# Patient Record
Sex: Female | Born: 1965 | Race: Black or African American | Hispanic: No | Marital: Married | Smoking: Never smoker
Health system: Southern US, Community
[De-identification: ages and names within clinical notes are randomized; demographics above are authoritative.]

## PROBLEM LIST (undated history)

## (undated) DIAGNOSIS — T7840XA Allergy, unspecified, initial encounter: Secondary | ICD-10-CM

## (undated) DIAGNOSIS — N840 Polyp of corpus uteri: Secondary | ICD-10-CM

## (undated) DIAGNOSIS — Z973 Presence of spectacles and contact lenses: Secondary | ICD-10-CM

## (undated) DIAGNOSIS — D509 Iron deficiency anemia, unspecified: Secondary | ICD-10-CM

## (undated) HISTORY — DX: Allergy, unspecified, initial encounter: T78.40XA

## (undated) HISTORY — PX: TUBAL LIGATION: SHX77

---

## 1999-07-18 ENCOUNTER — Emergency Department (HOSPITAL_COMMUNITY): Admission: EM | Admit: 1999-07-18 | Discharge: 1999-07-18 | Payer: Self-pay | Admitting: Emergency Medicine

## 1999-07-22 ENCOUNTER — Emergency Department (HOSPITAL_COMMUNITY): Admission: EM | Admit: 1999-07-22 | Discharge: 1999-07-22 | Payer: Self-pay | Admitting: Emergency Medicine

## 2000-03-14 ENCOUNTER — Encounter: Payer: Self-pay | Admitting: Emergency Medicine

## 2000-03-14 ENCOUNTER — Emergency Department (HOSPITAL_COMMUNITY): Admission: EM | Admit: 2000-03-14 | Discharge: 2000-03-14 | Payer: Self-pay | Admitting: Emergency Medicine

## 2001-08-14 ENCOUNTER — Emergency Department (HOSPITAL_COMMUNITY): Admission: EM | Admit: 2001-08-14 | Discharge: 2001-08-14 | Payer: Self-pay | Admitting: Emergency Medicine

## 2002-08-16 ENCOUNTER — Other Ambulatory Visit: Admission: RE | Admit: 2002-08-16 | Discharge: 2002-08-16 | Payer: Self-pay | Admitting: Obstetrics and Gynecology

## 2003-01-24 ENCOUNTER — Emergency Department (HOSPITAL_COMMUNITY): Admission: EM | Admit: 2003-01-24 | Discharge: 2003-01-24 | Payer: Self-pay | Admitting: Emergency Medicine

## 2003-02-13 ENCOUNTER — Inpatient Hospital Stay (HOSPITAL_COMMUNITY): Admission: RE | Admit: 2003-02-13 | Discharge: 2003-02-16 | Payer: Self-pay | Admitting: Obstetrics and Gynecology

## 2003-03-27 ENCOUNTER — Other Ambulatory Visit: Admission: RE | Admit: 2003-03-27 | Discharge: 2003-03-27 | Payer: Self-pay | Admitting: Obstetrics and Gynecology

## 2003-12-28 ENCOUNTER — Other Ambulatory Visit: Admission: RE | Admit: 2003-12-28 | Discharge: 2003-12-28 | Payer: Self-pay | Admitting: Obstetrics and Gynecology

## 2004-06-27 ENCOUNTER — Inpatient Hospital Stay (HOSPITAL_COMMUNITY): Admission: RE | Admit: 2004-06-27 | Discharge: 2004-06-30 | Payer: Self-pay | Admitting: Obstetrics and Gynecology

## 2004-06-27 ENCOUNTER — Encounter (INDEPENDENT_AMBULATORY_CARE_PROVIDER_SITE_OTHER): Payer: Self-pay | Admitting: *Deleted

## 2004-08-08 ENCOUNTER — Other Ambulatory Visit: Admission: RE | Admit: 2004-08-08 | Discharge: 2004-08-08 | Payer: Self-pay | Admitting: Obstetrics and Gynecology

## 2006-07-06 LAB — HM DEXA SCAN

## 2007-01-11 ENCOUNTER — Ambulatory Visit: Payer: Self-pay | Admitting: Gastroenterology

## 2007-01-25 ENCOUNTER — Ambulatory Visit: Payer: Self-pay | Admitting: Gastroenterology

## 2008-06-23 LAB — HM COLONOSCOPY: HM Colonoscopy: NORMAL

## 2010-01-21 LAB — HM PAP SMEAR

## 2010-01-21 LAB — HM DEXA SCAN

## 2010-03-26 ENCOUNTER — Encounter: Admission: RE | Admit: 2010-03-26 | Discharge: 2010-03-26 | Payer: Self-pay | Admitting: Obstetrics and Gynecology

## 2010-06-12 ENCOUNTER — Ambulatory Visit: Payer: Self-pay | Admitting: Family Medicine

## 2010-06-23 LAB — HM MAMMOGRAPHY

## 2010-07-13 ENCOUNTER — Encounter: Payer: Self-pay | Admitting: Obstetrics and Gynecology

## 2010-11-08 NOTE — H&P (Signed)
NAMEANJANETTE, GILKEY             ACCOUNT NO.:  [id number]   MEDICAL RECORD NO.:  [id number]          PATIENT TYPE:  INP   LOCATION:  NA                            FACILITY:  [facility]   PHYSICIAN:  Juluis Mire, M.D.   DATE OF BIRTH:  [birth year]   DATE OF ADMISSION:  DATE OF DISCHARGE:                                HISTORY & PHYSICAL   CHIEF COMPLAINT:  The patient is a 45 year old gravida 3 para 2 married  female with last menstrual period of September 30, 2003 giving her an estimated  date of confinement of July 06, 2004 and estimated gestational age of  approximately 39 weeks.  This is consistent with initial exam and early  ultrasounds.  The patient presents for repeat cesarean section.   PRESENT ADMISSION:  The patient has had two prior cesarean sections.  She  will proceed with repeat cesarean section at the present time.  She is  desirous of permanent sterilization.  Alternative forms of birth control  have been discussed.  The potential irreversibility of sterilization is  explained.  A failure rate of 1 in 200 is quoted.  Failures can be in the  form of ectopic pregnancy requiring further surgical management.   The patient was also at risk for advanced maternal age.  She underwent first  trimester screen at [facility] [place] with a negative  first trimester ultrasound and blood work.  She declined amniocentesis.  Follow-up second trimester ultrasound and neural tube screening were also  negative.  She has also had a hemoglobin electrophoresis that indicated no  evidence of sickle cell trait.  She has no complications during the  pregnancy.   ALLERGIES:  The patient has no known drug allergies.   MEDICATIONS:  Include prenatal vitamins.   For past medical history, family history, and social history, please see  prenatal records.   REVIEW OF SYSTEMS:  Noncontributory.   PHYSICAL EXAMINATION:  VITAL SIGNS:  The patient is afebrile with stable  vital  signs.  HEENT:  The patient is normocephalic.  Pupils equal, round, and reactive to  light and accommodation.  Extraocular movements were intact.  Sclerae and  conjunctivae were clear, oropharynx clear.  NECK:  Without thyromegaly.  BREASTS:  Glandular but no discrete masses.  LUNGS:  Clear.  CARDIOVASCULAR:  Regular rate and rhythm.  There is a grade 2/6 systolic  ejection murmur felt to be a flow murmur.  ABDOMEN:  Confirms a gravid uterus consistent with dates.  There is a well-  healed low transverse incision.  PELVIC:  It is deferred at the present time due to planned cesarean section.  EXTREMITIES:  Trace edema.  NEUROLOGIC:  Grossly within normal limits.  Deep tendon reflexes 2+.  There  is no clonus.   IMPRESSION:  1.  Intrauterine pregnancy at term with two prior cesarean sections, desires      repeat.  2.  Advanced maternal age.   PLAN:  At the present time, the patient to undergo repeat cesarean section  with bilateral tubal ligation.  The risks of surgery have been discussed  including the risk of infection; the risk of hemorrhage that could require  transfusion with the risk of AIDS or hepatitis; the risk of injury to  adjacent organs including bladder, bowel, or ureters that could require  further exploratory surgery; the risk of deep venous thrombosis and  pulmonary embolus.  The patient professed an understanding of the  indications and risks and accepting of them.      JSM/MEDQ  D:  06/27/2004  T:  06/27/2004  Job:  [id number]

## 2010-11-08 NOTE — Discharge Summary (Signed)
[name], [name]             ACCOUNT NO.:  [id number]   MEDICAL RECORD NO.:  [id number]          PATIENT TYPE:  INP   LOCATION:  9103                          [facility]ACILITY:  [facility]   PHYSICIAN:  Michelle L. Grewal, M.D.DATE OF BIRTH:  1965-10-18   DATE OF ADMISSION:  06/27/2004  DATE OF DISCHARGE:  06/30/2004                                 DISCHARGE SUMMARY   ADMITTING DIAGNOSES:  1.  Intrauterine pregnancy at term.  2.  Previous cesarean section, desires repeat.  3.  Multiparity, desires sterility.   DISCHARGE DIAGNOSES:  1.  Status post low transverse cesarean section.  2.  Viable female infant.  3.  Permanent sterilization.   PROCEDURE:  1.  Repeat low transverse cesarean section.  2.  Bilateral tubal ligation.   REASON FOR ADMISSION:  Please see dictated H&P.   HOSPITAL COURSE:  The patient was a 45 year old married female gravida 3  para 2 that was admitted to [facility] for a scheduled  cesarean section.  The patient had had a previous cesarean delivery and  desired repeat.  Due to multiparity the patient had also requested permanent  sterilization.  On the morning of admission the patient was taken to the  operating room where spinal anesthesia was administered without difficulty.  A low transverse incision was made with the delivery of a viable female infant  weighing 7 pounds 4 ounces, Apgars of 9 at one minute and 9 at five minutes.  Bilateral tubal ligation was performed without difficulty.  The patient  tolerated the procedure well and was taken to the recovery room in stable  condition.  On postoperative day #1 the patient was without complaint.  Vital signs were stable, she was afebrile.  Abdomen was soft, fundus was  firm and nontender.  Abdominal dressing was noted to be clean, dry, and  intact.  Labs revealed hemoglobin of 9.0; platelet count of 137,000; wbc  count of 8.5.  On postoperative day #2 the patient was without complaint.  Vital signs  were stable.  Abdomen was soft, fundus was firm and nontender.  Abdominal dressing had been removed revealing an incision that was clean,  dry, and intact.  She was ambulating well and tolerating a regular diet  without complaints of nausea and vomiting.  On postoperative day #3 the  patient was doing well.  She was uncertain about discharge for that day.  Vital signs were stable.  Fundus was firm and nontender.  Incision was  clean, dry, and intact.  Staples were removed and the patient later decided  to be discharged home. Instructions had been reviewed and prescriptions had  been given and the patient was discharged home.   CONDITION ON DISCHARGE:  Good.   DIET:  Regular as tolerated.   ACTIVITY:  No heavy lifting, no driving x2 weeks, no vaginal entry.   FOLLOW-UP:  The patient is to follow up in the office in 1-2 weeks for an  incision check.  She is to call for temperature greater than 100 degrees,  persistent nausea and vomiting, heavy vaginal bleeding, and/or redness or  drainage from  the incisional site.   DISCHARGE MEDICATIONS:  1.  Tylox #30 one p.o. q.4-6h. p.r.n.  2.  Motrin 600 mg q.6h.  3.  Prenatal vitamins one p.o. daily.  4.  Colace one p.o. daily p.r.n.      CC/MEDQ  D:  07/19/2004  T:  07/19/2004  Job:  [id number]

## 2010-11-08 NOTE — H&P (Signed)
NAME:  [name], [name]                         ACCOUNT NO.:  [id number]   MEDICAL RECORD NO.:  [id number]                   PATIENT TYPE:   LOCATION:                                       FACILITY:   PHYSICIAN:  Juluis Mire, M.D.                DATE OF BIRTH:   DATE OF ADMISSION:  02/13/2003  DATE OF DISCHARGE:                                HISTORY & PHYSICAL   HISTORY OF PRESENT ILLNESS:  The patient is a 45 year old gravida 3, para 1  married female who presents for repeat cesarean section.   The patient's last menstrual period is in question.  Ultrasound estimated  date of confinement of August 31 giving her an estimated gestational age of  [redacted] weeks.  She had a prior low transverse cesarean section due to fetal  intolerance of labor.  She has declined trial of labor, wishing to proceed  with repeat cesarean section for which she is admitted at the present time.   Other risk factors:  She was at risk for advanced maternal age.  We had  discussed with her increasing risk of genetic anomalies associated with  advanced maternal age.  She declined genetic amniocentesis.  She did undergo  a targeted ultrasound that did reveal an echogenic focus in the left  ventricle.  Her maternal serum screen did indicate an increased risk of Down  syndrome to 1 in 150.  She still declined amniocentesis.  She was referred  to the [facility] [place].  A subsequent targeted  ultrasound failed to reveal any abnormality and the patient maintained the  pregnancy without any further evaluation.  Only other issue she was screened  positive for group B Strep.   ALLERGIES:  No known drug allergies.   MEDICATIONS:  Prenatal vitamins.   PRENATAL LABORATORIES:  The patient's blood type is B+.  She is negative  antibody screen.  Nonreactive serology.  Immune to rubella.  Negative  hepatitis B surface antigen.   PAST MEDICAL HISTORY:  Please see prenatal records.   FAMILY  HISTORY:  Please see prenatal records.   SOCIAL HISTORY:  Please see prenatal records.   REVIEW OF SYSTEMS:  Noncontributory.   PHYSICAL EXAMINATION:  VITAL SIGNS:  The patient is afebrile with stable  vital signs.  HEENT:  The patient normocephalic.  Pupils are equal, round, and reactive to  light and accommodation.  Extraocular movements are intact.  Sclerae and  conjunctivae clear.  Oropharynx clear.  NECK:  Without thyromegaly.  BREASTS:  No discreet mass.  LUNGS:  Clear.  CARDIAC:  Regular rhythm and rate without murmurs or gallops.  ABDOMEN:  Consistent with a gravid uterus consistent with dates.  PELVIC:  Cervix is long and closed.  EXTREMITIES:  Trace edema.  NEUROLOGIC:  Grossly within normal limits.   IMPRESSION:  Intrauterine pregnancy at term with prior cesarean section,  desires a repeat.  PLAN:  The patient will undergo repeat cesarean section.  The risks of  surgery have been discussed including the risk of infection, the risk of  hemorrhage that could necessitate transfusion with the risk of AIDS or  hepatitis, the risk of injury to adjacent organs including bladder, bowel,  ureters that could require further exploratory surgery, the risk of deep  venous thrombosis and pulmonary embolus.  The patient expressed  understanding of indications and risks and accepting of them.                                               Juluis Mire, M.D.    JSM/MEDQ  D:  02/13/2003  T:  02/13/2003  Job:  [id number]

## 2010-11-08 NOTE — Op Note (Signed)
[name], [name]             ACCOUNT NO.:  [id number]   MEDICAL RECORD NO.:  [id number]          PATIENT TYPE:  INP   LOCATION:  9198                          [facility]ACILITY:  [facility]   PHYSICIAN:  Juluis Mire, M.D.   DATE OF BIRTH:  1966/04/30   DATE OF PROCEDURE:  06/27/2004  DATE OF DISCHARGE:                                 OPERATIVE REPORT   PREOPERATIVE DIAGNOSES:  1.  Intrauterine pregnancy at term with prior cesarean section, desires      repeat.  2.  Multiparity, desires sterility.   POSTOPERATIVE DIAGNOSES:  1.  Intrauterine pregnancy at term with prior cesarean section, desires      repeat.  2.  Multiparity, desires sterility.   OPERATIVE PROCEDURES:  1.  Repeat low transverse cesarean section.  2.  Bilateral tubal ligation.   SURGEON:  Juluis Mire, M.D.   ANESTHESIA:  Spinal.   ESTIMATED BLOOD LOSS:  500 mL.   PACKS AND DRAINS:  None.   INTRAOPERATIVE BLOOD REPLACED:  None.   COMPLICATIONS:  None.   INDICATIONS:  Dictated in the history and physical.   The procedure was as follows:  The patient was taken into the OR, placed in  the supine position with left lateral tilt.  After a satisfactory level of  spinal anesthesia was obtained, the abdomen was prepped out with Betadine  and draped as a sterile field.  A low transverse skin incision was  identified and excised.  The incision was extended through the subcutaneous  tissue.  The fascia was entered sharply and the incision in the fascia  extended laterally.  The fascia taken off the muscle superiorly inferiorly.  The rectus muscles were separated in the midline.  The peritoneum was  entered sharply and the incision in the peritoneum was extended both  superiorly and inferiorly.  A low transverse bladder flap was developed, a  low transverse uterine incision begun with a knife and extended laterally  using manual traction.  Amniotic fluid was clear.  The infant was in the  vertex presentation,  delivered with fundal pressure and the aid of the  vacuum extractor.  The infant was a viable female who weighed 7 pounds 4  ounces, Apgars were 9/9, umbilical cord pH was 7.28.  The placenta was then  delivered manually, the uterus exteriorized for closure.  The uterus closed  using a locking suture of 2-0 chromic using two-layer closure technique.   Both tubes identified and held with a Babcock tenaculum.  A hole was made in  an avascular area of the mesosalpinx.  Individual ligatures of 0 plain  catgut were used to ligate off a segment of tube.  Intervening segment of  tube was then excised.  Cut ends of the tubes were cauterized using the  Bovie.  Both sides were adequately excised.  Ovaries were unremarkable.  The  uterus was returned to the abdominal cavity.  Urine output was clear.  We  got good hemostasis.  We irrigated the pelvis.  Again we had good  hemostasis.  Muscle was reapproximated with a running suture of 3-0 Vicryl,  fascia closed with running suture of 0 PDS, skin was closed with staples and  Steri-Strips.  Sponge, instrument, and needle count reported as correct by  circulating nurse x2.  Foley catheter remained clear at time of closure.  The patient tolerated the procedure well, was returned to the recovery room  in good condition.      JSM/MEDQ  D:  06/27/2004  T:  06/27/2004  Job:  [id number]

## 2010-11-08 NOTE — Discharge Summary (Signed)
NAME:  [name], [name]                       ACCOUNT NO.:  [id number]   MEDICAL RECORD NO.:  [id number]                   PATIENT TYPE:  INP   LOCATION:  9110                                 [facility]ACILITY:  [facility]   PHYSICIAN:  Duke Salvia. Marcelle Overlie, M.D.            DATE OF BIRTH:  1966/06/21   DATE OF ADMISSION:  02/13/2003  DATE OF DISCHARGE:  02/16/2003                                 DISCHARGE SUMMARY   ADMITTING DIAGNOSES:  1. Intrauterine pregnancy at term.  2. Previous cesarean delivery, desires repeat.   DISCHARGE DIAGNOSES:  1. Status post low transverse cesarean section.  2. Viable female infant.   PROCEDURE:  Repeat low transverse cesarean section.   REASON FOR ADMISSION:  Please see dictated H&P.   HOSPITAL COURSE:  The patient was a 45 year old gravida 3 para 1 married  female who presented to [facility] for a scheduled repeat  cesarean delivery.  The patient had had a previous cesarean delivery due to  fetal intolerance of labor.  She declined vaginal birth after cesarean.  On  the morning of admission the patient was taken to the operating room where  spinal anesthesia was administered without difficulty.  A low transverse  incision was made with the delivery of a viable female infant weighing 6  pounds 15 ounces, Apgars of 8 at one minute, 9 at five minutes.  The patient  tolerated the procedure well and was taken to the recovery room in stable  condition.  On postoperative day #1 vital signs were stable; she remained  afebrile.  Abdomen was soft with good return of bowel function.  Fundus was  firm and nontender.  Abdominal dressing was clean, dry, and intact.  Labs  revealed hemoglobin of 9.6; platelet count of 161,000; wbc count of 11.7.  On postoperative day #2 the patient was without complaint.  Vital signs were  stable; she remained afebrile.  Abdomen was soft.  Abdominal dressing was  removed revealing an incision that was clean, dry, and intact.   Fundus was  firm and nontender.  She was ambulating well, tolerating a regular diet  without complaints of nausea and vomiting.  On postoperative day #3 the  patient was without complaint.  Vital signs were stable.  Abdomen was soft.  Fundus was firm and nontender.  Incision was clean, dry, and intact.  Staples were removed and the patient was discharged home.   CONDITION ON DISCHARGE:  Good.   DIET:  Regular as tolerated.   ACTIVITY:  No heavy lifting, no driving x2 weeks, no vaginal entry.   FOLLOW-UP:  The patient is to follow up in the office in one to two weeks  for an incision check.  She is to call for temperature greater than 100  degrees, persistent nausea and vomiting, heavy vaginal bleeding, and/or  redness or drainage from the incisional site   DISCHARGE MEDICATIONS:  1. Percocet 5/325 #30 one p.o.  q.4-6h. p.r.n.  2.     Motrin 600 mg q.6h.  3. Prenatal vitamins one p.o. daily.  4. Colace one p.o. daily p.r.n.     Julio Sicks, N.P.                        Richard M. Marcelle Overlie, M.D.    CC/MEDQ  D:  03/01/2003  T:  03/01/2003  Job:  [id number]

## 2010-11-08 NOTE — Op Note (Signed)
NAME:  [name], [name]                       ACCOUNT NO.:  [id number]   MEDICAL RECORD NO.:  [id number]                   PATIENT TYPE:  INP   LOCATION:  9199                                 [facility]   PHYSICIAN:  Juluis Mire, M.D.                DATE OF BIRTH:  [birth year]   DATE OF PROCEDURE:  02/13/2003  DATE OF DISCHARGE:                                 OPERATIVE REPORT   PREOPERATIVE DIAGNOSES:  Pregnancy at term, prior cesarean section, desires  repeat.   POSTOPERATIVE DIAGNOSES:  Pregnancy at term, prior cesarean section, desires  repeat.   OPERATION/PROCEDURE:  Repeat low transverse cesarean section.   ANESTHESIA:  Spinal.   ESTIMATED BLOOD LOSS:  500 cc.   PACKS:  None.   DRAINS:  None.   INTRAOPERATIVE BLOOD REPLACEMENT:  None.   COMPLICATIONS:  None.   INDICATIONS FOR PROCEDURE:  Are as listed in the history and physical.   DESCRIPTION OF PROCEDURE:  The patient was taken to the OR, placed in the  supine position with left lateral tilt.  After satisfactory level of spinal  anesthesia was obtained, the abdomen was prepped widely with Betadine and  draped as a sterile field.  A prior low transverse skin incision was  identified and excised.  The incision was then extended through the  subcutaneous tissue.  The anterior rectus fascia was entered sharply and the  incision in the fascia was turned laterally.  The fascia was taken off the  muscle superiorly and inferiorly.  The rectus muscles were separated in the  midline.  The anterior peritoneum was entered sharply and the incision of  the peritoneum was extended then both superiorly and inferiorly.  A low  transverse bladder flap was developed.  A low transverse uterine incision  was developed with the knife and extended laterally using Mayo traction.  The infant presented in the vertex presentation and delivered with elevation  of the head and fundal pressure.  The infant was a viable female who  weighed 6  pounds 15 ounces, Apgars were 8/9 and umbilical cord pH was 7.25.  The  placenta was delivered manually.  The uterus was wiped free of any remaining  membranes and placenta.  The uterine incision was closed with a running  locking suture of 0 chromic using a two layered closure technique.  We had  good hemostasis.  Tubes and ovaries were unremarkable.  She had one  posterior wall fibroid that was mainly subserosal.  It did show some  evidence of necrosis, was left in place.  At this point in time, muscle was  reapproximated with a running sutures of 3-0 Vicryl.  The fascia was closed  with a running suture of 0 PDS.  The skin was closed with staples and Steri-  Strips.  Sponge, instrument and needle counts were reported as correct by  the surgical nurse x2.  The Foley catheter  was clear at the time of closure.  The patient tolerated the procedure well, was returned to the recovery room  in good condition.                                               Juluis Mire, M.D.    JSM/MEDQ  D:  02/13/2003  T:  02/13/2003  Job:  [id number]

## 2011-02-10 ENCOUNTER — Encounter: Payer: Self-pay | Admitting: Family Medicine

## 2011-02-10 ENCOUNTER — Ambulatory Visit (INDEPENDENT_AMBULATORY_CARE_PROVIDER_SITE_OTHER): Payer: 59 | Admitting: Family Medicine

## 2011-02-10 ENCOUNTER — Other Ambulatory Visit: Payer: Self-pay | Admitting: Family Medicine

## 2011-02-10 VITALS — BP 110/72 | HR 72 | Ht 65.0 in | Wt 146.0 lb

## 2011-02-10 DIAGNOSIS — R5381 Other malaise: Secondary | ICD-10-CM

## 2011-02-10 DIAGNOSIS — Z0000 Encounter for general adult medical examination without abnormal findings: Secondary | ICD-10-CM

## 2011-02-10 DIAGNOSIS — Z8349 Family history of other endocrine, nutritional and metabolic diseases: Secondary | ICD-10-CM

## 2011-02-10 DIAGNOSIS — Z83438 Family history of other disorder of lipoprotein metabolism and other lipidemia: Secondary | ICD-10-CM | POA: Insufficient documentation

## 2011-02-10 DIAGNOSIS — Z23 Encounter for immunization: Secondary | ICD-10-CM

## 2011-02-10 DIAGNOSIS — K4090 Unilateral inguinal hernia, without obstruction or gangrene, not specified as recurrent: Secondary | ICD-10-CM | POA: Insufficient documentation

## 2011-02-10 LAB — POCT URINALYSIS DIPSTICK
Bilirubin, UA: NEGATIVE
Blood, UA: NEGATIVE
Glucose, UA: NEGATIVE
Ketones, UA: NEGATIVE
Leukocytes, UA: NEGATIVE
Nitrite, UA: NEGATIVE
Protein, UA: NEGATIVE
Spec Grav, UA: 1.01
Urobilinogen, UA: NEGATIVE
pH, UA: 7

## 2011-02-10 NOTE — Patient Instructions (Addendum)
HEALTH MAINTENANCE RECOMMENDATIONS:  It is recommended that you get at least 30 minutes of aerobic exercise at least 5 days/week (for weight loss, you may need as much as 60-90 minutes). This can be any activity that gets your heart rate up. This can be divided in 10-15 minute intervals if needed, but try and build up your endurance at least once a week.  Weight bearing exercise is also recommended twice weekly.  Eat a healthy diet with lots of vegetables, fruits and fiber.  "Colorful" foods have a lot of vitamins (ie green vegetables, tomatoes, red peppers, etc).  Limit sweet tea, regular sodas and alcoholic beverages, all of which has a lot of calories and sugar.  Up to 1 alcoholic drink daily may be beneficial for women (unless trying to lose weight, watch sugars).  Drink a lot of water.  Calcium recommendations are 1200-1500 mg daily (1500 mg for postmenopausal women or women without ovaries), and vitamin D 1000 IU daily.  This should be obtained from diet and/or supplements (vitamins), and calcium should not be taken all at once, but in divided doses.  Monthly self breast exams and yearly mammograms for women over the age of 82 is recommended.  Sunscreen of at least SPF 30 should be used on all sun-exposed parts of the skin when outside between the hours of 10 am and 4 pm (not just when at beach or pool, but even with exercise, golf, tennis, and yard work!)  Use a sunscreen that says "broad spectrum" so it covers both UVA and UVB rays, and make sure to reapply every 1-2 hours.  Remember to change the batteries in your smoke detectors when changing your clock times in the spring and fall.  Use your seat belt every time you are in a car, and please drive safely and not be distracted with cell phones and texting while driving.  Colonoscopies are due every 5 years (due to your family history).  If your hernia becomes painful, and you are unable to push it back in, then you need to seek immediate  care

## 2011-02-10 NOTE — Progress Notes (Signed)
[name] is a 45 y.o. female who presents for a complete physical. Sees Dr. Arelia Sneddon for her GYN care.  She has the following concerns: Just wants to know what she should do to be healthy.  She states she has bilateral hernias, has seen a Careers adviser, but nothing was done.  Doesn't cause her pain or problems   Immunization History  Administered Date(s) Administered  . Influenza Whole 03/23/2010  Cannot recall when last tetanus shot was, possibly more than 10 years Last Pap smear: last year (02/2010) Last mammogram: 03/2010 Last colonoscopy: 2010--normal per pt Last DEXA: at [facility] Dentist: regular Ophtho: yearly (wears contacts)  Past Medical History  Diagnosis Date  . Anemia     Past Surgical History  Procedure Date  . Cesarean section x 3, [phone]  . Tubal ligation     History   Social History  . Marital Status: Married    Spouse Name: N/A    Number of Children: 3  . Years of Education: N/A   Occupational History  . hair dresser    Social History Main Topics  . Smoking status: Never Smoker   . Smokeless tobacco: Never Used  . Alcohol Use: No  . Drug Use: No  . Sexually Active: Yes -- Female partner(s)    Birth Control/ Protection: Surgical   Other Topics Concern  . Not on file   Social History Narrative   Lives with husband, 2 sons (oldest son is grown, is in prison)    Family History  Problem Relation Age of Onset  . Hyperlipidemia Mother   . Hypertension Mother   . Arthritis Mother   . Colon cancer Mother 33  . Hypertension Father   . Hyperlipidemia Father   . Stroke Brother   . Stroke Maternal Aunt 74  . Heart disease Maternal Aunt 72    CABG  . Stroke Maternal Grandmother   . Hypertension Brother   . Diabetes Maternal Aunt     Current outpatient prescriptions:Multiple Vitamins-Minerals (MULTIVITAMIN WITH MINERALS) tablet, Take 1 tablet by mouth daily.  , Disp: , Rfl:   No Known Allergies  ROS: The patient denies anorexia, fever,  weight changes, headaches,  vision changes, decreased hearing, ear pain, sore throat, breast concerns, chest pain, palpitations, dizziness, syncope, dyspnea on exertion, cough, swelling, nausea, vomiting, diarrhea, constipation, abdominal pain, melena, hematochezia, indigestion/heartburn, hematuria, incontinence, dysuria, irregular menstrual cycles, vaginal discharge, odor or itch, genital lesions, joint pains, numbness, tingling, weakness, tremor, suspicious skin lesions, depression, anxiety, abnormal bleeding/bruising, or enlarged lymph nodes.  +cough--a few times a year, and lasts for a long time.  Some stress urinary incontinence with the cough.  Doesn't currently have a cough.  Thinks she likely gets some allergies, with itchy eyes and sneezing periodically  PHYSICAL EXAM: BP 110/72  Pulse 72  Ht 5\' 5"  (1.651 m)  Wt 146 lb (66.225 kg)  BMI 24.30 kg/m2  LMP 02/02/2011  General Appearance:    Alert, cooperative, no distress, appears stated age  Head:    Normocephalic, without obvious abnormality, atraumatic  Eyes:    PERRL, conjunctiva/corneas clear, EOM's intact, fundi    benign  Ears:    Normal TM's and external ear canals  Nose:   Nares normal, mucosa normal, no drainage or sinus   tenderness  Throat:   Lips, mucosa, and tongue normal; teeth and gums normal  Neck:   Supple, no lymphadenopathy;  thyroid:  no   enlargement/tenderness/nodules; no carotid   bruit or JVD  Back:    Spine nontender, no curvature, ROM normal, no CVA     tenderness  Lungs:     Clear to auscultation bilaterally without wheezes, rales or     ronchi; respirations unlabored  Chest Wall:    No tenderness or deformity   Heart:    Regular rate and rhythm, S1 and S2 normal, no murmur, rub   or gallop  Breast Exam:    Deferred to GYN  Abdomen:     Soft, non-tender, nondistended, normoactive bowel sounds,    no masses, no hepatosplenomegaly. LIH hernia--easily reducible  Genitalia:    Deferred to GYN       Extremities:   No clubbing, cyanosis or edema  Pulses:   2+ and symmetric all extremities  Skin:   Skin color, texture, turgor normal, no rashes or lesions  Lymph nodes:   Cervical, supraclavicular, and axillary nodes normal  Neurologic:   CNII-XII intact, normal strength, sensation and gait; reflexes 2+ and symmetric throughout          Psych:   Normal mood, affect, hygiene and grooming.    ASSESSMENT/PLAN: 1. Routine general medical examination at a health care facility  POCT Urinalysis Dipstick, Visual acuity screening, Lipid panel  2. Need for Tdap vaccination  Tdap vaccine greater than or equal to 7yo IM  3. Other malaise and fatigue  Comprehensive metabolic panel, CBC with Differential, Vitamin D 25 hydroxy, TSH  4. Family history of hyperlipidemia  Lipid panel  5. Inguinal hernia, left     Discussed monthly self breast exams and yearly mammograms after the age of 35; at least 30 minutes of aerobic activity at least 5 days/week; proper sunscreen use reviewed; healthy diet, including goals of calcium and vitamin D intake and alcohol recommendations (less than or equal to 1 drink/day) reviewed; regular seatbelt use; changing batteries in smoke detectors.  Immunization recommendations discussed--TdaP given.  Colonoscopy recommendations reviewed--every 5 years, currently UTD

## 2011-02-11 LAB — CBC WITH DIFFERENTIAL/PLATELET
Basophils Absolute: 0 10*3/uL (ref 0.0–0.1)
Basophils Relative: 1 % (ref 0–1)
Eosinophils Absolute: 0.1 10*3/uL (ref 0.0–0.7)
HCT: 35 % — ABNORMAL LOW (ref 36.0–46.0)
MCH: 26.8 pg (ref 26.0–34.0)
MCHC: 30 g/dL (ref 30.0–36.0)
Monocytes Absolute: 0.3 10*3/uL (ref 0.1–1.0)
Neutro Abs: 2.2 10*3/uL (ref 1.7–7.7)
RDW: 15.3 % (ref 11.5–15.5)

## 2011-02-11 LAB — COMPREHENSIVE METABOLIC PANEL
ALT: 9 U/L (ref 0–35)
AST: 17 U/L (ref 0–37)
Albumin: 4.3 g/dL (ref 3.5–5.2)
Calcium: 9.5 mg/dL (ref 8.4–10.5)
Chloride: 105 mEq/L (ref 96–112)
Creat: 0.81 mg/dL (ref 0.50–1.10)
Potassium: 4.5 mEq/L (ref 3.5–5.3)

## 2011-02-11 LAB — FERRITIN: Ferritin: 4 ng/mL — ABNORMAL LOW (ref 10–291)

## 2011-02-11 LAB — TSH: TSH: 0.84 u[IU]/mL (ref 0.350–4.500)

## 2011-02-11 LAB — LIPID PANEL
LDL Cholesterol: 80 mg/dL (ref 0–99)
VLDL: 6 mg/dL (ref 0–40)

## 2011-02-12 ENCOUNTER — Other Ambulatory Visit: Payer: Self-pay | Admitting: *Deleted

## 2011-02-12 ENCOUNTER — Telehealth: Payer: Self-pay | Admitting: *Deleted

## 2011-02-12 DIAGNOSIS — D509 Iron deficiency anemia, unspecified: Secondary | ICD-10-CM

## 2011-02-12 NOTE — Telephone Encounter (Signed)
Spoke with patients to give her lab results. She was notified of iron deficient anemia and will start a diet with high iron containing foods as well as start taking an OTC iron suppplement twice daily. She will use Colace if constipation should occur. Patient is scheduled for October 24,2012 @ 9:00 for [facility] and [facility] recheck dx 280.9(orders in system).

## 2011-04-16 ENCOUNTER — Other Ambulatory Visit: Payer: 59

## 2011-05-21 ENCOUNTER — Other Ambulatory Visit: Payer: Self-pay | Admitting: Obstetrics and Gynecology

## 2011-05-21 DIAGNOSIS — R928 Other abnormal and inconclusive findings on diagnostic imaging of breast: Secondary | ICD-10-CM

## 2011-05-22 ENCOUNTER — Encounter: Payer: Self-pay | Admitting: Family Medicine

## 2011-05-22 ENCOUNTER — Ambulatory Visit (INDEPENDENT_AMBULATORY_CARE_PROVIDER_SITE_OTHER): Payer: 59 | Admitting: Family Medicine

## 2011-05-22 DIAGNOSIS — R05 Cough: Secondary | ICD-10-CM

## 2011-05-22 DIAGNOSIS — J309 Allergic rhinitis, unspecified: Secondary | ICD-10-CM

## 2011-05-22 DIAGNOSIS — R059 Cough, unspecified: Secondary | ICD-10-CM

## 2011-05-22 MED ORDER — FLUTICASONE FUROATE 27.5 MCG/SPRAY NA SUSP: 1.0000 | Freq: Every day | NASAL | Status: DC

## 2011-05-22 NOTE — Progress Notes (Signed)
Patient presents with complaint of cough x 1 month.  Productive of phlegm--sometimes clear, sometimes light green.  A week ago she had some redness to her R eye, but no itching.  Denies sneezing or runny nose, but describes her nose as always being moist.  Denies sore throat, heartburn. Slight throat clearing.  Denies recent URI or allergies.  Cough is worse at night.  Sometimes gets spasms of cough, which is usually dry and hacky.  Tried cheratussin, which seemed to help in the past with cough (contains decongestant)-this time didn't help with cough other than helping her sleep some.  Also tried Mucinex (plain) and Halls cough drops.  The cough drops help her when she's at work.  Denies shortness of breath.  She coughs more with exercise or when she's hot (hasn't exercised recently, but noticed earlier in the illness when on elliptical).  Past Medical History  Diagnosis Date  . Anemia     Past Surgical History  Procedure Date  . Cesarean section x 3, [phone]  . Tubal ligation     History   Social History  . Marital Status: Married    Spouse Name: N/A    Number of Children: 3  . Years of Education: N/A   Occupational History  . hair dresser    Social History Main Topics  . Smoking status: Never Smoker   . Smokeless tobacco: Never Used  . Alcohol Use: No  . Drug Use: No  . Sexually Active: Yes -- Female partner(s)    Birth Control/ Protection: Surgical   Other Topics Concern  . Not on file   Social History Narrative   Lives with husband, 2 sons (oldest son is grown, is in prison)    Family History  Problem Relation Age of Onset  . Hyperlipidemia Mother   . Hypertension Mother   . Arthritis Mother   . Colon cancer Mother 49  . Hypertension Father   . Hyperlipidemia Father   . Stroke Brother   . Stroke Maternal Aunt 74  . Heart disease Maternal Aunt 72    CABG  . Stroke Maternal Grandmother   . Hypertension Brother   . Diabetes Maternal Aunt    Current  Outpatient Prescriptions on File Prior to Visit  Medication Sig Dispense Refill  . Multiple Vitamins-Minerals (MULTIVITAMIN WITH MINERALS) tablet Take 1 tablet by mouth daily.         No Known Allergies  ROS:  Denies fever, sinus pain, sore throat, chest pain, shortness of breath, nausea, vomiting, diarrhea, skin rash, myalgias or other concerns  PHYSICAL EXAM: BP 108/64  Pulse 68  Temp(Src) 98.2 F (36.8 C) (Oral)  LMP 05/20/2011 Well developed, pleasant female in no distress.  Rare dry cough HEENT:  Nasal mucosa moderately edematous, pale with clear mucus.  TM's and EAC's normal.  OP clear.  Sinuses nontender.  Mild pterygium R>L, with mild conjunctival irritation bilaterally Lungs clear bilaterally, no wheeze or cough with forced expiration Heart: regular rate and rhythm without murmur Skin: no rash  ASSESSMENT/PLAN: 1. Cough    2. Allergic rhinitis, cause unspecified  fluticasone (VERAMYST) 27.5 MCG/SPRAY nasal spray   Cough--most likely due to allergies and postnasal drip. No evidence of wheezing/RAD on exam Start Zyrtec or Claritin.  Sample of steroid spray.  If cough ongoing, call for [employer].  May stop the steroid spray when it runs out, if cough is better. If cough returns a week or two later, then she is to call for Rx  of the nasal steroid spray.  She should continue to use the antihistamine.  Antibiotic may be necessary if she develops fever, or discolored (yellow/green) phlegm

## 2011-05-22 NOTE — Patient Instructions (Signed)
Start Veramyst as instructed in package information. Start Claritin or Zyrtec, and take on a daily basis  Call in 7-10 days if cough not improving.  Let us know if at any time you develop fever, or discolored (yellow/green) phlegm, in which case an antibiotic may be necessary  I think your cough is mainly coming from postnasal drip, most likely related to allergies

## 2011-06-04 ENCOUNTER — Ambulatory Visit
Admission: RE | Admit: 2011-06-04 | Discharge: 2011-06-04 | Disposition: A | Discharge status: Home / Self Care | Payer: 59 | Source: Ambulatory Visit | Attending: Obstetrics and Gynecology | Admitting: Obstetrics and Gynecology

## 2011-06-04 DIAGNOSIS — R928 Other abnormal and inconclusive findings on diagnostic imaging of breast: Secondary | ICD-10-CM

## 2011-11-19 ENCOUNTER — Encounter: Payer: Self-pay | Admitting: Family Medicine

## 2011-11-19 ENCOUNTER — Ambulatory Visit (INDEPENDENT_AMBULATORY_CARE_PROVIDER_SITE_OTHER): Payer: 59 | Admitting: Family Medicine

## 2011-11-19 VITALS — BP 100/64 | HR 72 | Temp 98.2°F | Ht 65.0 in | Wt 143.0 lb

## 2011-11-19 DIAGNOSIS — J069 Acute upper respiratory infection, unspecified: Secondary | ICD-10-CM

## 2011-11-19 DIAGNOSIS — H1030 Unspecified acute conjunctivitis, unspecified eye: Secondary | ICD-10-CM

## 2011-11-19 DIAGNOSIS — B349 Viral infection, unspecified: Secondary | ICD-10-CM

## 2011-11-19 DIAGNOSIS — B9789 Other viral agents as the cause of diseases classified elsewhere: Secondary | ICD-10-CM

## 2011-11-19 MED ORDER — POLYMYXIN B-TRIMETHOPRIM 10000-0.1 UNIT/ML-% OP SOLN: 1.0000 [drp] | OPHTHALMIC | Status: AC

## 2011-11-19 NOTE — Progress Notes (Signed)
Chief Complaint  Patient presents with  . Fever    started Saturday with chills and low grade temp and sore throat. Also coughing and her eyes are matted shut when she wakes up.    HPI: Woke up 4 days ago, did a Zumba class, felt fine, but then 1 hour later started with chills, felt warm, headache, sore throat and decreased appetite.  Cough has gotten worse.  Waking up in the mornings with her eyes crusted shut with green goop, itchy.  Having some nasal congestion, worse at night, intermittent frontal headaches.  +postnasal drainage. Cough is dry, unable to expectorate phlegm.  Denies shortness of breath  Fevers/chills have resolved.  Ongoing goopy eyes.  Cough getting worse.  Son sick with allergies/asthma, cough.  Used Nyquil the first 2 days, and then Tylenol cold and flu--some help with chills and body aches.    Past Medical History  Diagnosis Date  . Anemia    Past Surgical History  Procedure Date  . Cesarean section x 3, [phone]  . Tubal ligation    History   Social History  . Marital Status: Married    Spouse Name: N/A    Number of Children: 3  . Years of Education: N/A   Occupational History  . hair dresser    Social History Main Topics  . Smoking status: Never Smoker   . Smokeless tobacco: Never Used  . Alcohol Use: No  . Drug Use: No  . Sexually Active: Yes -- Female partner(s)    Birth Control/ Protection: Surgical   Other Topics Concern  . Not on file   Social History Narrative   Lives with husband, 2 sons (oldest son is grown, is in prison)   Current Outpatient Prescriptions on File Prior to Visit  Medication Sig Dispense Refill  . Multiple Vitamins-Minerals (MULTIVITAMIN WITH MINERALS) tablet Take 1 tablet by mouth daily.        . fluticasone (VERAMYST) 27.5 MCG/SPRAY nasal spray Place 1 spray into the nose daily.  4.5 g  0   No Known Allergies  ROS: Denies nausea, vomiting, diarrhea, skin rash, bleeding, bruising, urinary complaints.  PHYSICAL  EXAM: BP 100/64  Pulse 72  Temp(Src) 98.2 F (36.8 C) (Oral)  Ht 5\' 5"  (1.651 m)  Wt 143 lb (64.864 kg)  BMI 23.80 kg/m2  LMP 10/20/2011  Well developed, pleasant female, in no distress.  Hoarse voice, occasional sniffling and throat-clearing.  Later during visit had dry hacky cough, which was stopped with sip of water. HEENT:  PERRL, EOMI ,conjunctiva moderately injected, R>L.  TM's and EAC's normal.  Nasal mucosa moderately edematous, some whitish yellow discharge.  Sinuses nontender. OP--some cobblestoning posteriorly Neck: shotty, nontender lymphadenopathy Heart: regular rate and rhythm without murmur Lungs: clear bilaterally.  No wheeze with forced expiration Skin: no rash   ASSESSMENT/PLAN: 1. Conjunctivitis, acute  trimethoprim-polymyxin b (POLYTRIM) ophthalmic solution  2. URI (upper respiratory infection)    3. Viral syndrome     Viral syndrome Conjunctivitis--mostly likely viral  polytrim eye drops.  Infectious precautions reviewed.  Mucinex-D tablets twice daily to help with nasal congestion, postnasal drainage and cough. You can continue to use tylenol or ibuprofen as needed for pain, headache, fever. If you are having a lot of coughing despite using the Mucinex-D, you can also take Delsym syrup as needed for cough (twice daily, as needed)  Drink plenty of fluids.  Call or return if worsening symptoms--ie recurrent fevers, worsening cough, shortness of breath, or other concerns

## 2011-11-19 NOTE — Patient Instructions (Signed)
Mucinex-D tablets twice daily to help with nasal congestion, postnasal drainage and cough. You can continue to use tylenol or ibuprofen as needed for pain, headache, fever. If you are having a lot of coughing despite using the Mucinex-D, you can also take Delsym syrup as needed for cough (twice daily, as needed)  Drink plenty of fluids.  Call or return if worsening symptoms--ie recurrent fevers, worsening cough, shortness of breath, or other concerns  Eye drops--use until eye symptoms have completely resolved.  Pinkeye is very contagious--wash hands frequently, avoid touching eyes.

## 2011-11-21 ENCOUNTER — Telehealth: Payer: Self-pay | Admitting: Family Medicine

## 2011-11-21 NOTE — Telephone Encounter (Signed)
tussionex called in per jcl

## 2011-11-21 NOTE — Telephone Encounter (Signed)
Call in Tussionex 

## 2011-11-21 NOTE — Telephone Encounter (Signed)
Med called in and pt informed 

## 2011-11-22 ENCOUNTER — Other Ambulatory Visit: Payer: Self-pay | Admitting: Family Medicine

## 2011-11-22 MED ORDER — CLARITHROMYCIN 500 MG PO TABS: 500.0000 mg | ORAL_TABLET | Freq: Two times a day (BID) | ORAL | Status: AC

## 2011-11-22 NOTE — Progress Notes (Signed)
Erythromycin given. Daughter has pertussis

## 2012-02-04 ENCOUNTER — Encounter: Payer: Self-pay | Admitting: Gastroenterology

## 2012-05-19 ENCOUNTER — Encounter: Payer: Self-pay | Admitting: Gastroenterology

## 2012-05-27 ENCOUNTER — Other Ambulatory Visit: Payer: Self-pay | Admitting: Obstetrics and Gynecology

## 2012-05-27 DIAGNOSIS — R928 Other abnormal and inconclusive findings on diagnostic imaging of breast: Secondary | ICD-10-CM

## 2012-06-07 ENCOUNTER — Ambulatory Visit
Admission: RE | Admit: 2012-06-07 | Discharge: 2012-06-07 | Disposition: A | Discharge status: Home / Self Care | Payer: 59 | Source: Ambulatory Visit | Attending: Obstetrics and Gynecology | Admitting: Obstetrics and Gynecology

## 2012-06-07 ENCOUNTER — Other Ambulatory Visit: Payer: Self-pay | Admitting: Obstetrics and Gynecology

## 2012-06-07 DIAGNOSIS — R928 Other abnormal and inconclusive findings on diagnostic imaging of breast: Secondary | ICD-10-CM

## 2012-06-07 LAB — HM MAMMOGRAPHY

## 2012-06-10 ENCOUNTER — Other Ambulatory Visit: Payer: Self-pay | Admitting: Obstetrics and Gynecology

## 2012-06-10 ENCOUNTER — Ambulatory Visit
Admission: RE | Admit: 2012-06-10 | Discharge: 2012-06-10 | Disposition: A | Discharge status: Home / Self Care | Payer: 59 | Source: Ambulatory Visit | Attending: Obstetrics and Gynecology | Admitting: Obstetrics and Gynecology

## 2012-06-10 DIAGNOSIS — R928 Other abnormal and inconclusive findings on diagnostic imaging of breast: Secondary | ICD-10-CM

## 2012-06-24 ENCOUNTER — Encounter (INDEPENDENT_AMBULATORY_CARE_PROVIDER_SITE_OTHER): Payer: Self-pay | Admitting: General Surgery

## 2012-06-24 ENCOUNTER — Ambulatory Visit (INDEPENDENT_AMBULATORY_CARE_PROVIDER_SITE_OTHER): Payer: 59 | Admitting: General Surgery

## 2012-06-24 VITALS — BP 132/86 | HR 80 | Temp 98.6°F | Resp 16 | Ht 64.0 in | Wt 146.4 lb

## 2012-06-24 DIAGNOSIS — N6099 Unspecified benign mammary dysplasia of unspecified breast: Secondary | ICD-10-CM

## 2012-06-24 DIAGNOSIS — N62 Hypertrophy of breast: Secondary | ICD-10-CM

## 2012-06-24 NOTE — Progress Notes (Signed)
Subjective:   abnormal mammogram and atypical hyperplasia  Patient ID: [name], female   DOB: May 08, 1966, 47 y.o.   MRN: [id number]  HPI Patient is a very pleasant 47 year old female referred by Dr. Deboraha Sprang at the breast center. She recently presented for her routine screening mammogram. I personally reviewed all her imaging. Screening and subsequent diagnostic mammogram has revealed a very small localized area of punctate calcifications in the upper right breast. Stereotactic core biopsy was recommended and performed. Pathology has revealed fibrocystic change but also atypical lobular hyperplasia associated with calcifications. The patient has no previous history of breast disease or any personal or family history of breast cancer. She had not been able to feel a lump. No nipple discharge or skin changes. Complete excision of the area was recommended based on these findings.  Past Medical History  Diagnosis Date  . Anemia    Past Surgical History  Procedure Date  . Cesarean section x 3, [phone]  . Tubal ligation    Current Outpatient Prescriptions  Medication Sig Dispense Refill  . IRON PO Take by mouth daily.      . Multiple Vitamins-Minerals (MULTIVITAMIN WITH MINERALS) tablet Take 1 tablet by mouth daily.        . fluticasone (VERAMYST) 27.5 MCG/SPRAY nasal spray Place 1 spray into the nose daily.  4.5 g  0   No Known Allergies   Review of Systems  Constitutional: Negative.   Respiratory: Negative.   Cardiovascular: Negative.        Objective:   Physical Exam General: Thin healthy-appearing African American female in no distress Skin: Warm and dry without rash or infection HEENT: No palpable mass or thyromegaly. Sclera nonicteric Lymph nodes: No cervical, supraclavicular or axillary nodes palpable Breasts: Slight thickening at the core biopsy site in the upper right breast. No other palpable abnormalities in either breast. Lungs: Clear equal breath sounds  bilaterally without increased work of breathing Cardiac: Regular rate and rhythm without murmur. Extremities: The swelling or deformity Neurologic: Alert and oriented. Affect normal.    Assessment:     Mammogram revealing 2 small cluster of microcalcifications in the upper outer right breast and core biopsy has revealed atypical lobular hyperplasia. Excision of the area has been recommended. I discussed this in detail with the patient and her friend. Options of mammographic followup versus excision were discussed. We discussed that there is a very slight possibility that there is a more advanced lesion associated with this that could be found with complete excision. She would like to go ahead and have this removed which I think is reasonable. We discussed needle localized lumpectomy and use a general anesthesia as an outpatient. The risks of bleeding, infection and anesthetic risks were discussed and understood. We discussed that further surgery could be indicated based on final pathology. She was given literature regarding breast surgery breast biopsy all her questions were answered.    Plan:     Needle localized right breast lumpectomy as an outpatient under general anesthesia

## 2012-06-24 NOTE — Patient Instructions (Signed)
Breast Biopsy A breast biopsy is a procedure where a sample of breast tissue is removed from your breast. The tissue is examined under a microscope to see if cancerous cells are present. A breast biopsy is done when there is:  Any undiagnosed breast mass (tumor).  Nipple abnormalities, dimpling, crusting, or ulcerations.  Abnormal discharge from the nipple, especially blood.  Redness, swelling, and pain of the breast.  Calcium deposits (calcifications) or abnormalities seen on a mammogram, ultrasound result, or results of magnetic resonance imaging (MRI).  Suspicious changes in the breast seen on your mammogram. If the tumor is found to be cancerous (malignant), a breast biopsy can help to determine what the best treatment is for you. There are many different types of breast biopsies. Talk to your caregiver about your options and which type is best for you. LET YOUR CAREGIVER KNOW ABOUT:  Allergies to food or medicine.  Medicines taken, including vitamins, herbs, eyedrops, over-the-counter medicines, and creams.  Use of steroids (by mouth or creams).  Previous problems with anesthetics or numbing medicines.  History of bleeding problems or blood clots.  Previous surgery.  Other health problems, including diabetes and kidney problems.  Any recent colds or infections.  Possibility of pregnancy, if this applies. RISKS AND COMPLICATIONS   Bleeding.  Infection.  Allergy to medicines.  Bruising and swelling of the breast.  Alteration in the shape of the breast.  Not finding the lump or abnormality.  Needing more surgery. BEFORE THE PROCEDURE  Arrange for someone to drive you home after the procedure.  Do not smoke for 2 weeks before the procedure. Stop smoking, if you smoke.  Do not drink alcohol for 24 hours before procedure.  Wear a good support bra to the procedure. PROCEDURE  You may be given a medicine to numb the breast area (local anesthesia) or a medicine  to make you sleep (general anesthesia) during the procedure. The following are the different types of biopsies that can be performed.   Fine-needle aspiration A thin needle is attached to a syringe and inserted into the breast lump. Fluid and cells are removed and then looked at under a microscope. If the breast lump cannot be felt, an ultrasound may be used to help locate the lump and place the needle in the correct area.   Core needle biopsy A wide, hollow needle (core needle) is inserted into the breast lump 3 6 times to get tissue samples or cores. The samples are removed. The needle is usually placed in the correct area by using an ultrasound or X-ray.   Stereotactic biopsy X-ray equipment and a computer are used to analyze X-ray pictures of the breast lump. The computer then finds exactly where the core needle needs to be inserted. Tissue samples are removed.   Vacuum-assisted biopsy A small incision (less than  inch) is made in your breast. A biopsy device that includes a hollow needle and vacuum is passed through the incision and into the breast tissue. The vacuum gently draws abnormal breast tissue into the needle to remove it. This type of biopsy removes a larger tissue sample than a regular core needle biopsy. No stitches are needed, and there is usually little scarring.  Ultrasound-guided core needle biopsy A high frequency ultrasound helps guide the core needle to the area of the mass or abnormality. An incision is made to insert the needle. Tissue samples are removed.  Open biopsy A larger incision is made in the breast. Your caregiver will attempt   to remove the whole breast lump or as much as possible. AFTER THE PROCEDURE  You will be taken to the recovery area. If you are doing well and have no problems, you will be allowed to go home.  You may notice bruising on your breast. This is normal.  Your caregiver may apply a pressure dressing on your breast for 24 48 hours. A  pressure dressing is a bandage that is wrapped tightly around the chest to stop fluid from collecting underneath tissues. Document Released: 06/09/2005 Document Revised: 12/09/2011 Document Reviewed: 07/10/2011 [employer] Patient Information 2013 [employer].  

## 2012-06-30 ENCOUNTER — Ambulatory Visit (AMBULATORY_SURGERY_CENTER): Payer: 59

## 2012-06-30 VITALS — Ht 64.0 in | Wt 147.0 lb

## 2012-06-30 DIAGNOSIS — Z800 Family history of malignant neoplasm of digestive organs: Secondary | ICD-10-CM

## 2012-06-30 DIAGNOSIS — Z1211 Encounter for screening for malignant neoplasm of colon: Secondary | ICD-10-CM

## 2012-06-30 MED ORDER — MOVIPREP 100 G PO SOLR: 1.0000 | Freq: Once | ORAL | Status: DC

## 2012-07-05 ENCOUNTER — Other Ambulatory Visit (INDEPENDENT_AMBULATORY_CARE_PROVIDER_SITE_OTHER): Payer: Self-pay | Admitting: General Surgery

## 2012-07-05 DIAGNOSIS — N6099 Unspecified benign mammary dysplasia of unspecified breast: Secondary | ICD-10-CM

## 2012-07-12 ENCOUNTER — Emergency Department (HOSPITAL_BASED_OUTPATIENT_CLINIC_OR_DEPARTMENT_OTHER)
Admission: EM | Admit: 2012-07-12 | Discharge: 2012-07-12 | Disposition: A | Discharge status: Home / Self Care | Payer: 59 | Attending: Emergency Medicine | Admitting: Emergency Medicine

## 2012-07-12 ENCOUNTER — Encounter (HOSPITAL_BASED_OUTPATIENT_CLINIC_OR_DEPARTMENT_OTHER): Payer: Self-pay | Admitting: *Deleted

## 2012-07-12 DIAGNOSIS — Y9229 Other specified public building as the place of occurrence of the external cause: Secondary | ICD-10-CM | POA: Insufficient documentation

## 2012-07-12 DIAGNOSIS — D649 Anemia, unspecified: Secondary | ICD-10-CM | POA: Insufficient documentation

## 2012-07-12 DIAGNOSIS — Y9389 Activity, other specified: Secondary | ICD-10-CM | POA: Insufficient documentation

## 2012-07-12 DIAGNOSIS — Z79899 Other long term (current) drug therapy: Secondary | ICD-10-CM | POA: Insufficient documentation

## 2012-07-12 DIAGNOSIS — W1809XA Striking against other object with subsequent fall, initial encounter: Secondary | ICD-10-CM | POA: Insufficient documentation

## 2012-07-12 DIAGNOSIS — S0990XA Unspecified injury of head, initial encounter: Secondary | ICD-10-CM | POA: Insufficient documentation

## 2012-07-12 NOTE — ED Notes (Signed)
Fall outside earlier today while at [employer]. Does not remember events prior or after the fall.

## 2012-07-12 NOTE — ED Provider Notes (Signed)
History  This chart was scribed for Charles B. Bernette Mayers, MD by Ardeen Jourdain, ED Scribe. This patient was seen in room MH02/MH02 and the patient's care was started at 2149.  CSN: [id number]  Arrival date & time 07/12/12  2121   First MD Initiated Contact with Patient 07/12/12 2149      Chief Complaint  Patient presents with  . Fall     The history is provided by the patient. No language interpreter was used.    [name] is a 47 y.o. female who presents to the Emergency Department complaining of HA from a fall. She states she was chasing her nephew when she fell and hit her head on a chair. She is not sure what caused her to fall. She denies visual disturbance, CP, SOB, abdominal pain, nausea, emesis, diarrhea, urinary symptoms, back pain, weakness and numbness as associated symptoms. She denies any LOC and states she has been acting normal all day after the accident.   Past Medical History  Diagnosis Date  . Anemia     Past Surgical History  Procedure Date  . Cesarean section x 3, [phone]  . Tubal ligation     Family History  Problem Relation Age of Onset  . Hyperlipidemia Mother   . Hypertension Mother   . Arthritis Mother   . Colon cancer Mother 4  . Cancer Mother     colon  . Hypertension Father   . Hyperlipidemia Father   . Stroke Brother   . Stroke Maternal Aunt 74  . Heart disease Maternal Aunt 72    CABG  . Stroke Maternal Grandmother   . Hypertension Brother   . Diabetes Maternal Aunt     History  Substance Use Topics  . Smoking status: Never Smoker   . Smokeless tobacco: Never Used  . Alcohol Use: No    OB History    Grav Para Term Preterm Abortions TAB SAB Ect Mult Living   3 3 3              Review of Systems  All other systems reviewed and are negative.   A complete 10 system review of systems was obtained and all systems are negative except as noted in the HPI and PMH.    Allergies  Review of patient's allergies  indicates no known allergies.  Home Medications   Current Outpatient Rx  Name  Route  Sig  Dispense  Refill  . FLUTICASONE FUROATE 27.5 MCG/SPRAY NA SUSP   Nasal   Place 1 spray into the nose daily.   4.5 g   0   . IRON PO   Oral   Take by mouth daily.         Marland Kitchen MOVIPREP 100 G PO SOLR   Oral   Take 1 kit (100 g total) by mouth once.   1 kit   0     Dispense as written.   . MULTI-VITAMIN/MINERALS PO TABS   Oral   Take 1 tablet by mouth daily.             Triage Vitals: BP 118/76  Pulse 87  Temp 98.2 F (36.8 C) (Oral)  Resp 18  Ht 5\' 4"  (1.626 m)  Wt 145 lb (65.772 kg)  BMI 24.89 kg/m2  SpO2 100%  LMP 05/29/2012  Physical Exam  Nursing note and vitals reviewed. Constitutional: She is oriented to person, place, and time. She appears well-developed and well-nourished.  HENT:  Head: Normocephalic.  Hematoma to forehead   Eyes: EOM are normal. Pupils are equal, round, and reactive to light.  Neck: Normal range of motion. Neck supple.  Cardiovascular: Normal rate, normal heart sounds and intact distal pulses.   Pulmonary/Chest: Effort normal and breath sounds normal.  Abdominal: Bowel sounds are normal. She exhibits no distension. There is no tenderness.  Musculoskeletal: Normal range of motion. She exhibits no edema and no tenderness.  Neurological: She is alert and oriented to person, place, and time. She has normal strength. No cranial nerve deficit or sensory deficit.  Skin: Skin is warm and dry. No rash noted.  Psychiatric: She has a normal mood and affect. Her behavior is normal.    ED Course  Procedures (including critical care time)  DIAGNOSTIC STUDIES: Oxygen Saturation is 100% on room air, normal by my interpretation.    COORDINATION OF CARE:  9:52 PM: Discussed treatment plan which includes tylenol as needed with pt at bedside and pt agreed to plan.     Labs Reviewed - No data to display No results found.   No diagnosis  found.    MDM  Minor head injury with no concern for intracranial process.     I personally performed the services described in this documentation, which was scribed in my presence. The recorded information has been reviewed and is accurate.      Charles B. Bernette Mayers, MD 07/12/12 2201

## 2012-07-14 ENCOUNTER — Encounter: Payer: Self-pay | Admitting: Gastroenterology

## 2012-07-14 ENCOUNTER — Ambulatory Visit (AMBULATORY_SURGERY_CENTER): Payer: 59 | Admitting: Gastroenterology

## 2012-07-14 VITALS — BP 118/77 | HR 77 | Temp 98.4°F | Resp 20 | Ht 64.0 in | Wt 147.0 lb

## 2012-07-14 DIAGNOSIS — Z1211 Encounter for screening for malignant neoplasm of colon: Secondary | ICD-10-CM

## 2012-07-14 DIAGNOSIS — Z83438 Family history of other disorder of lipoprotein metabolism and other lipidemia: Secondary | ICD-10-CM

## 2012-07-14 DIAGNOSIS — Z800 Family history of malignant neoplasm of digestive organs: Secondary | ICD-10-CM

## 2012-07-14 DIAGNOSIS — K5730 Diverticulosis of large intestine without perforation or abscess without bleeding: Secondary | ICD-10-CM

## 2012-07-14 MED ORDER — SODIUM CHLORIDE 0.9 % IV SOLN: 500.0000 mL | INTRAVENOUS | Status: DC

## 2012-07-14 NOTE — Patient Instructions (Addendum)
Discharge instructions given with verbal understanding. Handouts on diverticulosis and a high fiber diet given. Resume previous medications.YOU HAD AN ENDOSCOPIC PROCEDURE TODAY AT THE [facility]: Refer to the procedure report that was given to you for any specific questions about what was found during the examination.  If the procedure report does not answer your questions, please call your gastroenterologist to clarify.  If you requested that your care partner not be given the details of your procedure findings, then the procedure report has been included in a sealed envelope for you to review at your convenience later.  YOU SHOULD EXPECT: Some feelings of bloating in the abdomen. Passage of more gas than usual.  Walking can help get rid of the air that was put into your GI tract during the procedure and reduce the bloating. If you had a lower endoscopy (such as a colonoscopy or flexible sigmoidoscopy) you may notice spotting of blood in your stool or on the toilet paper. If you underwent a bowel prep for your procedure, then you may not have a normal bowel movement for a few days.  DIET: Your first meal following the procedure should be a light meal and then it is ok to progress to your normal diet.  A half-sandwich or bowl of soup is an example of a good first meal.  Heavy or fried foods are harder to digest and may make you feel nauseous or bloated.  Likewise meals heavy in dairy and vegetables can cause extra gas to form and this can also increase the bloating.  Drink plenty of fluids but you should avoid alcoholic beverages for 24 hours.  ACTIVITY: Your care partner should take you home directly after the procedure.  You should plan to take it easy, moving slowly for the rest of the day.  You can resume normal activity the day after the procedure however you should NOT DRIVE or use heavy machinery for 24 hours (because of the sedation medicines used during the test).    SYMPTOMS TO  REPORT IMMEDIATELY: A gastroenterologist can be reached at any hour.  During normal business hours, 8:30 AM to 5:00 PM Monday through Friday, call [phone].  After hours and on weekends, please call the GI answering service at [phone] who will take a message and have the physician on call contact you.   Following lower endoscopy (colonoscopy or flexible sigmoidoscopy):  Excessive amounts of blood in the stool  Significant tenderness or worsening of abdominal pains  Swelling of the abdomen that is new, acute  Fever of 100F or higher  FOLLOW UP: If any biopsies were taken you will be contacted by phone or by letter within the next 1-3 weeks.  Call your gastroenterologist if you have not heard about the biopsies in 3 weeks.  Our staff will call the home number listed on your records the next business day following your procedure to check on you and address any questions or concerns that you may have at that time regarding the information given to you following your procedure. This is a courtesy call and so if there is no answer at the home number and we have not heard from you through the emergency physician on call, we will assume that you have returned to your regular daily activities without incident.  SIGNATURES/CONFIDENTIALITY: You and/or your care partner have signed paperwork which will be entered into your electronic medical record.  These signatures attest to the fact that that the information above on your   After Visit Summary has been reviewed and is understood.  Full responsibility of the confidentiality of this discharge information lies with you and/or your care-partner.   

## 2012-07-14 NOTE — Progress Notes (Signed)
Patient did not experience any of the following events: a burn prior to discharge; a fall within the facility; wrong site/side/patient/procedure/implant event; or a hospital transfer or hospital admission upon discharge from the facility. (G8907) Patient did not have preoperative order for IV antibiotic SSI prophylaxis. (G8918)  

## 2012-07-14 NOTE — Op Note (Signed)
[facility] [address] [place], [address]   COLONOSCOPY PROCEDURE REPORT  PATIENT: [name], [name]  MR#: [id number] BIRTHDATE: Dec 27, 1965 , 46  yrs. old GENDER: Female ENDOSCOPIST: Mardella Layman, MD, [facility] REFERRED BY: PROCEDURE DATE:  07/14/2012 PROCEDURE:   Colonoscopy, screening ASA CLASS:   Class II INDICATIONS:Patient's immediate family history of colon cancer. MEDICATIONS: propofol (Diprivan) 200mg  IV  DESCRIPTION OF PROCEDURE:   After the risks and benefits and of the procedure were explained, informed consent was obtained.  A digital rectal exam revealed no abnormalities of the rectum.    The LB PCF-H180AL [id number] and LB CF-H180AL [id number]  endoscope was introduced through the anus and advanced to the cecum, which was identified by both the appendix and ileocecal valve .  The quality of the prep was poor, using MoviPrep .  The instrument was then slowly withdrawn as the colon was fully examined.     COLON FINDINGS: There was moderate diverticulosis noted in the descending colon and sigmoid colon with associated muscular hypertrophy, tortuosity and petechiae.   The colon was otherwise normal.  There was no , inflammation, polyps or cancers unless previously stated Very  POOR.  PREP as before..   Retroflexed views revealed no abnormalities.     The scope was then withdrawn from the patient and the procedure completed.  COMPLICATIONS: There were no complications. ENDOSCOPIC IMPRESSION: 1.   There was moderate diverticulosis noted in the descending colon and sigmoid colon 2.   The colon was otherwise normal ..poor prep,very redundant colon.Marland Kitchen  RECOMMENDATIONS: 1.  Given your significant family history of colon cancer, you should have a repeat colonoscopy in 5 years..DOUBLE PREP NEEDED BEFORE NEXT EXAM!!!!!! 2.  High fiber diet   REPEAT EXAM:  cc:Joselyn Arrow, MD  _______________________________ eSigned:  Mardella Layman, MD, [facility]  07/14/2012 10:10 AM

## 2012-07-15 ENCOUNTER — Telehealth: Payer: Self-pay | Admitting: *Deleted

## 2012-07-15 NOTE — Telephone Encounter (Signed)
No answer, left message to call if questions or concerns. 

## 2012-08-10 ENCOUNTER — Encounter (HOSPITAL_BASED_OUTPATIENT_CLINIC_OR_DEPARTMENT_OTHER): Payer: Self-pay | Admitting: *Deleted

## 2012-08-10 NOTE — Progress Notes (Signed)
Bring all medications

## 2012-08-13 ENCOUNTER — Encounter (HOSPITAL_BASED_OUTPATIENT_CLINIC_OR_DEPARTMENT_OTHER): Payer: Self-pay | Admitting: Anesthesiology

## 2012-08-13 ENCOUNTER — Encounter (HOSPITAL_BASED_OUTPATIENT_CLINIC_OR_DEPARTMENT_OTHER)
Admission: RE | Disposition: A | Discharge status: Home / Self Care | Payer: Self-pay | Source: Ambulatory Visit | Attending: General Surgery

## 2012-08-13 ENCOUNTER — Ambulatory Visit (HOSPITAL_BASED_OUTPATIENT_CLINIC_OR_DEPARTMENT_OTHER)
Admission: RE | Admit: 2012-08-13 | Discharge: 2012-08-13 | Disposition: A | Discharge status: Home / Self Care | Payer: 59 | Source: Ambulatory Visit | Attending: General Surgery | Admitting: General Surgery

## 2012-08-13 ENCOUNTER — Ambulatory Visit
Admission: RE | Admit: 2012-08-13 | Discharge: 2012-08-13 | Disposition: A | Discharge status: Home / Self Care | Payer: 59 | Source: Ambulatory Visit | Attending: General Surgery | Admitting: General Surgery

## 2012-08-13 ENCOUNTER — Other Ambulatory Visit (INDEPENDENT_AMBULATORY_CARE_PROVIDER_SITE_OTHER): Payer: Self-pay | Admitting: General Surgery

## 2012-08-13 ENCOUNTER — Ambulatory Visit (HOSPITAL_BASED_OUTPATIENT_CLINIC_OR_DEPARTMENT_OTHER): Payer: 59 | Admitting: Anesthesiology

## 2012-08-13 DIAGNOSIS — R920 Mammographic microcalcification found on diagnostic imaging of breast: Secondary | ICD-10-CM

## 2012-08-13 DIAGNOSIS — N6099 Unspecified benign mammary dysplasia of unspecified breast: Secondary | ICD-10-CM

## 2012-08-13 DIAGNOSIS — C50919 Malignant neoplasm of unspecified site of unspecified female breast: Secondary | ICD-10-CM

## 2012-08-13 DIAGNOSIS — D649 Anemia, unspecified: Secondary | ICD-10-CM | POA: Insufficient documentation

## 2012-08-13 DIAGNOSIS — N6089 Other benign mammary dysplasias of unspecified breast: Secondary | ICD-10-CM | POA: Insufficient documentation

## 2012-08-13 DIAGNOSIS — D0590 Unspecified type of carcinoma in situ of unspecified breast: Secondary | ICD-10-CM | POA: Insufficient documentation

## 2012-08-13 HISTORY — PX: BREAST BIOPSY: SHX20

## 2012-08-13 SURGERY — BREAST BIOPSY WITH NEEDLE LOCALIZATION
Anesthesia: General | Site: Breast | Laterality: Right | Wound class: Clean

## 2012-08-13 MED ORDER — LIDOCAINE HCL (CARDIAC) 20 MG/ML IV SOLN
INTRAVENOUS | Status: DC | PRN
  Administered 2012-08-13: 50 mg via INTRAVENOUS

## 2012-08-13 MED ORDER — ACETAMINOPHEN 10 MG/ML IV SOLN
1000.0000 mg | Freq: Once | INTRAVENOUS | Status: AC
  Administered 2012-08-13: 1000 mg via INTRAVENOUS

## 2012-08-13 MED ORDER — FENTANYL CITRATE 0.05 MG/ML IJ SOLN
INTRAMUSCULAR | Status: DC | PRN
  Administered 2012-08-13: 50 ug via INTRAVENOUS

## 2012-08-13 MED ORDER — PROPOFOL 10 MG/ML IV BOLUS
INTRAVENOUS | Status: DC | PRN
  Administered 2012-08-13: 150 mg via INTRAVENOUS

## 2012-08-13 MED ORDER — MIDAZOLAM HCL 5 MG/5ML IJ SOLN
INTRAMUSCULAR | Status: DC | PRN
  Administered 2012-08-13: 2 mg via INTRAVENOUS

## 2012-08-13 MED ORDER — OXYCODONE HCL 5 MG PO TABS: 5.0000 mg | ORAL_TABLET | Freq: Once | ORAL | Status: DC | PRN

## 2012-08-13 MED ORDER — METOCLOPRAMIDE HCL 5 MG/ML IJ SOLN: 10.0000 mg | Freq: Once | INTRAMUSCULAR | Status: DC | PRN

## 2012-08-13 MED ORDER — SCOPOLAMINE 1 MG/3DAYS TD PT72
1.0000 | MEDICATED_PATCH | Freq: Once | TRANSDERMAL | Status: DC
  Administered 2012-08-13: 1.5 mg via TRANSDERMAL

## 2012-08-13 MED ORDER — DEXAMETHASONE SODIUM PHOSPHATE 4 MG/ML IJ SOLN
INTRAMUSCULAR | Status: DC | PRN
  Administered 2012-08-13: 10 mg via INTRAVENOUS

## 2012-08-13 MED ORDER — ONDANSETRON HCL 4 MG/2ML IJ SOLN
INTRAMUSCULAR | Status: DC | PRN
  Administered 2012-08-13: 4 mg via INTRAVENOUS

## 2012-08-13 MED ORDER — KETOROLAC TROMETHAMINE 30 MG/ML IJ SOLN
INTRAMUSCULAR | Status: DC | PRN
  Administered 2012-08-13: 30 mg via INTRAVENOUS

## 2012-08-13 MED ORDER — FENTANYL CITRATE 0.05 MG/ML IJ SOLN: 50.0000 ug | INTRAMUSCULAR | Status: DC | PRN

## 2012-08-13 MED ORDER — HYDROMORPHONE HCL PF 1 MG/ML IJ SOLN: 0.2500 mg | INTRAMUSCULAR | Status: DC | PRN

## 2012-08-13 MED ORDER — HYDROCODONE-ACETAMINOPHEN 5-325 MG PO TABS: 1.0000 | ORAL_TABLET | ORAL | Status: DC | PRN

## 2012-08-13 MED ORDER — BUPIVACAINE-EPINEPHRINE 0.25% -1:200000 IJ SOLN
INTRAMUSCULAR | Status: DC | PRN
  Administered 2012-08-13: 18.5 mL

## 2012-08-13 MED ORDER — CHLORHEXIDINE GLUCONATE 4 % EX LIQD: 1.0000 "application " | Freq: Once | CUTANEOUS | Status: DC

## 2012-08-13 MED ORDER — OXYCODONE HCL 5 MG/5ML PO SOLN: 5.0000 mg | Freq: Once | ORAL | Status: DC | PRN

## 2012-08-13 MED ORDER — LACTATED RINGERS IV SOLN
INTRAVENOUS | Status: DC
  Administered 2012-08-13: 12:00:00 via INTRAVENOUS

## 2012-08-13 MED ORDER — MIDAZOLAM HCL 2 MG/2ML IJ SOLN: 1.0000 mg | INTRAMUSCULAR | Status: DC | PRN

## 2012-08-13 MED ORDER — CEFAZOLIN SODIUM-DEXTROSE 2-3 GM-% IV SOLR
2.0000 g | INTRAVENOUS | Status: AC
  Administered 2012-08-13: 2 g via INTRAVENOUS

## 2012-08-13 MED ORDER — CEFAZOLIN SODIUM-DEXTROSE 2-3 GM-% IV SOLR
2.0000 g | INTRAVENOUS | Status: DC
Start: 2012-08-14 — End: 2012-08-13

## 2012-08-13 SURGICAL SUPPLY — 48 items
BLADE SURG 10 STRL SS (BLADE) IMPLANT
BLADE SURG 15 STRL LF DISP TIS (BLADE) ×1 IMPLANT
BLADE SURG 15 STRL SS (BLADE) ×1
CANISTER SUCTION 1200CC (MISCELLANEOUS) IMPLANT
CHLORAPREP W/TINT 26ML (MISCELLANEOUS) ×2 IMPLANT
CLIP TI MEDIUM 6 (CLIP) IMPLANT
CLIP TI WIDE RED SMALL 6 (CLIP) IMPLANT
CLOTH BEACON ORANGE TIMEOUT ST (SAFETY) ×2 IMPLANT
COVER MAYO STAND STRL (DRAPES) ×2 IMPLANT
COVER TABLE BACK 60X90 (DRAPES) ×2 IMPLANT
DERMABOND ADVANCED (GAUZE/BANDAGES/DRESSINGS)
DERMABOND ADVANCED .7 DNX12 (GAUZE/BANDAGES/DRESSINGS) IMPLANT
DEVICE DUBIN W/COMP PLATE 8390 (MISCELLANEOUS) IMPLANT
DRAPE PED LAPAROTOMY (DRAPES) ×2 IMPLANT
DRAPE UTILITY XL STRL (DRAPES) ×2 IMPLANT
ELECT COATED BLADE 2.86 ST (ELECTRODE) ×2 IMPLANT
ELECT REM PT RETURN 9FT ADLT (ELECTROSURGICAL) ×2
ELECTRODE REM PT RTRN 9FT ADLT (ELECTROSURGICAL) ×1 IMPLANT
GLOVE BIOGEL M 7.0 STRL (GLOVE) ×2 IMPLANT
GLOVE BIOGEL PI IND STRL 7.5 (GLOVE) ×1 IMPLANT
GLOVE BIOGEL PI IND STRL 8 (GLOVE) ×1 IMPLANT
GLOVE BIOGEL PI INDICATOR 7.5 (GLOVE) ×1
GLOVE BIOGEL PI INDICATOR 8 (GLOVE) ×1
GLOVE SS BIOGEL STRL SZ 7.5 (GLOVE) ×1 IMPLANT
GLOVE SUPERSENSE BIOGEL SZ 7.5 (GLOVE) ×1
GOWN PREVENTION PLUS XLARGE (GOWN DISPOSABLE) ×2 IMPLANT
GOWN PREVENTION PLUS XXLARGE (GOWN DISPOSABLE) ×2 IMPLANT
KIT MARKER MARGIN INK (KITS) IMPLANT
NEEDLE HYPO 25X1 1.5 SAFETY (NEEDLE) ×2 IMPLANT
NS IRRIG 1000ML POUR BTL (IV SOLUTION) ×2 IMPLANT
PACK BASIN DAY SURGERY FS (CUSTOM PROCEDURE TRAY) ×2 IMPLANT
PENCIL BUTTON HOLSTER BLD 10FT (ELECTRODE) ×2 IMPLANT
SLEEVE SCD COMPRESS KNEE MED (MISCELLANEOUS) ×2 IMPLANT
STAPLER VISISTAT 35W (STAPLE) IMPLANT
SUT MON AB 3-0 SH 27 (SUTURE)
SUT MON AB 3-0 SH27 (SUTURE) IMPLANT
SUT MON AB 5-0 PS2 18 (SUTURE) ×2 IMPLANT
SUT SILK 3 0 SH 30 (SUTURE) IMPLANT
SUT VIC AB 3-0 SH 27 (SUTURE) ×1
SUT VIC AB 3-0 SH 27X BRD (SUTURE) ×1 IMPLANT
SUT VIC AB 4-0 BRD 54 (SUTURE) IMPLANT
SYR BULB 3OZ (MISCELLANEOUS) IMPLANT
SYR CONTROL 10ML LL (SYRINGE) ×2 IMPLANT
TOWEL OR 17X24 6PK STRL BLUE (TOWEL DISPOSABLE) ×4 IMPLANT
TOWEL OR NON WOVEN STRL DISP B (DISPOSABLE) ×2 IMPLANT
TUBE CONNECTING 20X1/4 (TUBING) IMPLANT
WATER STERILE IRR 1000ML POUR (IV SOLUTION) ×2 IMPLANT
YANKAUER SUCT BULB TIP NO VENT (SUCTIONS) IMPLANT

## 2012-08-13 NOTE — H&P (Signed)
  HPI  Patient is a very pleasant 47 year old female referred by Dr. Deboraha Sprang at the breast center. She recently presented for her routine screening mammogram. I personally reviewed all her imaging. Screening and subsequent diagnostic mammogram has revealed a very small localized area of punctate calcifications in the upper right breast. Stereotactic core biopsy was recommended and performed. Pathology has revealed fibrocystic change but also atypical lobular hyperplasia associated with calcifications. The patient has no previous history of breast disease or any personal or family history of breast cancer. She had not been able to feel a lump. No nipple discharge or skin changes. Complete excision of the area was recommended based on these findings.    Past Medical History   Diagnosis  Date   .  Anemia     Past Surgical History   Procedure  Date   .  Cesarean section  x 3, [phone]   .  Tubal ligation     Current Outpatient Prescriptions   Medication  Sig  Dispense  Refill   .  IRON PO  Take by mouth daily.     .  Multiple Vitamins-Minerals (MULTIVITAMIN WITH MINERALS) tablet  Take 1 tablet by mouth daily.     .  fluticasone (VERAMYST) 27.5 MCG/SPRAY nasal spray  Place 1 spray into the nose daily.  4.5 g  0   No Known Allergies  Review of Systems  Constitutional: Negative.  Respiratory: Negative.  Cardiovascular: Negative.   Objective:   Physical Exam  BP 104/72  Pulse 76  Temp(Src) 98.3 F (36.8 C) (Oral)  Resp 16  Ht 5\' 4"  (1.626 m)  Wt 149 lb (67.586 kg)  BMI 25.56 kg/m2  SpO2 100%  LMP 08/09/2012  General: Thin healthy-appearing African American female in no distress  Skin: Warm and dry without rash or infection  HEENT: No palpable mass or thyromegaly. Sclera nonicteric  Lymph nodes: No cervical, supraclavicular or axillary nodes palpable  Breasts: Slight thickening at the core biopsy site in the upper right breast. No other palpable abnormalities in either breast.   Lungs: Clear equal breath sounds bilaterally without increased work of breathing  Cardiac: Regular rate and rhythm without murmur.  Extremities: The swelling or deformity  Neurologic: Alert and oriented. Affect normal.  Assessment:   Mammogram revealing 2 small cluster of microcalcifications in the upper outer right breast and core biopsy has revealed atypical lobular hyperplasia. Excision of the area has been recommended. I discussed this in detail with the patient and her friend. Options of mammographic followup versus excision were discussed. We discussed that there is a very slight possibility that there is a more advanced lesion associated with this that could be found with complete excision. She would like to go ahead and have this removed which I think is reasonable. We have discussed needle localized lumpectomy and use a general anesthesia as an outpatient. The risks of bleeding, infection and anesthetic risks were discussed and understood. We discussed that further surgery could be indicated based on final pathology. She was given literature regarding breast surgery breast biopsy all her questions were answered.   Plan:   Needle localized right breast lumpectomy as an outpatient under general anesthesia

## 2012-08-13 NOTE — Anesthesia Postprocedure Evaluation (Signed)
Anesthesia Post Note  Patient: [name]  Procedure(s) Performed: Procedure(s) (LRB): BREAST BIOPSY WITH NEEDLE LOCALIZATION (Right)  Anesthesia type: General  Patient location: PACU  Post pain: Pain level controlled  Post assessment: Patient's Cardiovascular Status Stable  Last Vitals:  Filed Vitals:   08/13/12 1430  BP: 116/75  Pulse: 86  Temp:   Resp: 17    Post vital signs: Reviewed and stable  Level of consciousness: alert  Complications: No apparent anesthesia complications

## 2012-08-13 NOTE — Anesthesia Preprocedure Evaluation (Signed)
Anesthesia Evaluation  Patient identified by MRN, date of birth, ID band Patient awake    Reviewed: Allergy & Precautions, H&P , NPO status , Patient's Chart, lab work & pertinent test results, reviewed documented beta blocker date and time   Airway Mallampati: II TM Distance: >3 FB Neck ROM: full    Dental   Pulmonary neg pulmonary ROS,  breath sounds clear to auscultation        Cardiovascular negative cardio ROS  Rhythm:regular     Neuro/Psych negative neurological ROS  negative psych ROS   GI/Hepatic negative GI ROS, Neg liver ROS,   Endo/Other  negative endocrine ROS  Renal/GU negative Renal ROS  negative genitourinary   Musculoskeletal   Abdominal   Peds  Hematology  (+) anemia ,   Anesthesia Other Findings See surgeon's H&P   Reproductive/Obstetrics negative OB ROS                           Anesthesia Physical Anesthesia Plan  ASA: I  Anesthesia Plan: General   Post-op Pain Management:    Induction: Intravenous  Airway Management Planned: LMA  Additional Equipment:   Intra-op Plan:   Post-operative Plan: Extubation in OR  Informed Consent: I have reviewed the patients History and Physical, chart, labs and discussed the procedure including the risks, benefits and alternatives for the proposed anesthesia with the patient or authorized representative who has indicated his/her understanding and acceptance.   Dental Advisory Given  Plan Discussed with: CRNA and Surgeon  Anesthesia Plan Comments:         Anesthesia Quick Evaluation

## 2012-08-13 NOTE — Op Note (Signed)
Preoperative Diagnosis: abnormal mammogram right breast  Postoprative Diagnosis: abnormal mammogram right breast  Procedure: Procedure(s): BREAST BIOPSY WITH NEEDLE LOCALIZATION   Surgeon: Glenna Fellows T   Assistants: none   Anesthesia:  General LMA anesthesiaDiagnos  Indications:   Patient is a 40 her old female who on recent screening mammogram was found to have a small cluster of microcalcifications in the upper-outer quadrant of the right breast. Large core needle biopsy was recommended and performed. This has revealed atypical lobular hyperplasia. We discussed with the patient options for observation versus excision and she has elected to proceed with excision to rule out underlying or serious lesion. We discussed indications for the procedure and risks of anesthetic complications, bleeding, infection, possibly for further surgery. All excess fluid localization she is brought to the operating room for this procedure.  Procedure Detail:  Patient brought to the operating room, placed in supine position on the operating table, and laryngeal mask general anesthesia induced. The right breast was widely sterilely prepped and draped. Correct patient and procedure were verified. A curvilinear incision was made near the areolar border dissection carried down to the saphenous tissue to the breast capsule. The wire was brought into the incision. A core of breast tissue was then excised around the shaft of the wire at the expected location of the lesion. The specimen was oriented with sutures. Specimen mammography showed the clip and calcifications contained within the specimen and the wire intact. Hemostasis was obtained with cautery. The soft tissue was infiltrated with Marcaine with epinephrine. The breast tissue and subcutaneous tissue were reapproximated with interrupted 3-0 Vicryl and the skin was closed with a subcuticular 5-0 Monocryl and Dermabond. Sponge needle and instrument counts were  correct.   Estimated Blood Loss:  Minimal         Drains: none  Blood Given: none          Specimens: right breast tissue        Complications:  * No complications entered in OR log *         Disposition: PACU - hemodynamically stable.         Condition: stable

## 2012-08-13 NOTE — Anesthesia Procedure Notes (Addendum)
Procedure Name: LMA Insertion Performed by: York Grice Pre-anesthesia Checklist: Patient identified, Timeout performed, Emergency Drugs available, Suction available and Patient being monitored Patient Re-evaluated:Patient Re-evaluated prior to inductionOxygen Delivery Method: Circle system utilized Preoxygenation: Pre-oxygenation with 100% oxygen Intubation Type: IV induction Ventilation: Mask ventilation without difficulty   Performed by: Sharyne Richters W LMA: LMA inserted LMA Size: 4.0 Number of attempts: 1 Placement Confirmation: breath sounds checked- equal and bilateral and positive ETCO2 Dental Injury: Teeth and Oropharynx as per pre-operative assessment

## 2012-08-13 NOTE — Transfer of Care (Signed)
Immediate Anesthesia Transfer of Care Note  Patient: [name]  Procedure(s) Performed: Procedure(s): BREAST BIOPSY WITH NEEDLE LOCALIZATION (Right)  Patient Location: PACU  Anesthesia Type:General  Level of Consciousness: awake  Airway & Oxygen Therapy: Patient Spontanous Breathing and Patient connected to face mask oxygen  Post-op Assessment: Report given to PACU RN and Post -op Vital signs reviewed and stable  Post vital signs: Reviewed and stable  Complications: No apparent anesthesia complications

## 2012-08-16 ENCOUNTER — Encounter (HOSPITAL_BASED_OUTPATIENT_CLINIC_OR_DEPARTMENT_OTHER): Payer: Self-pay | Admitting: General Surgery

## 2012-08-17 ENCOUNTER — Telehealth (INDEPENDENT_AMBULATORY_CARE_PROVIDER_SITE_OTHER): Payer: Self-pay | Admitting: General Surgery

## 2012-08-17 NOTE — Telephone Encounter (Signed)
No answer

## 2012-09-02 ENCOUNTER — Encounter (INDEPENDENT_AMBULATORY_CARE_PROVIDER_SITE_OTHER): Payer: Self-pay | Admitting: General Surgery

## 2012-09-02 ENCOUNTER — Ambulatory Visit (INDEPENDENT_AMBULATORY_CARE_PROVIDER_SITE_OTHER): Payer: 59 | Admitting: General Surgery

## 2012-09-02 VITALS — BP 112/70 | HR 84 | Temp 97.4°F | Resp 16 | Ht 64.0 in | Wt 148.0 lb

## 2012-09-02 DIAGNOSIS — D0590 Unspecified type of carcinoma in situ of unspecified breast: Secondary | ICD-10-CM

## 2012-09-02 DIAGNOSIS — D0501 Lobular carcinoma in situ of right breast: Secondary | ICD-10-CM

## 2012-09-02 NOTE — Progress Notes (Signed)
History: Patient returns for her postop check following needle localized right breast lumpectomy for a new area of microcalcifications. She reports no problems with the incision.  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  Assessment and plan: Doing well following his localized right breast excisional biopsy. We discussed the pathology report in detail. We discussed that no further specific treatment is needed for this lesion but that she is at somewhat increased risk bilaterally for future breast cancer and that she should carefully continue her screening exams. She was given a copy path report all her questions answered. Will return when necessary.

## 2012-09-02 NOTE — Patient Instructions (Addendum)
Be sure to continue yearly mammograms, yearly physician checkups and monthly self-exam.

## 2012-10-18 ENCOUNTER — Telehealth: Payer: Self-pay | Admitting: Gastroenterology

## 2012-10-18 NOTE — Telephone Encounter (Signed)
[facility] requested Colon Report from 07/14/2012 on 10/13/2012  Faxed Colon Report on 10/14/2012  asw

## 2013-02-14 ENCOUNTER — Ambulatory Visit (INDEPENDENT_AMBULATORY_CARE_PROVIDER_SITE_OTHER): Payer: 59 | Admitting: Family Medicine

## 2013-02-14 ENCOUNTER — Encounter: Payer: Self-pay | Admitting: Family Medicine

## 2013-02-14 VITALS — BP 100/70 | HR 84 | Ht 64.0 in | Wt 147.0 lb

## 2013-02-14 DIAGNOSIS — Z23 Encounter for immunization: Secondary | ICD-10-CM

## 2013-02-14 DIAGNOSIS — D649 Anemia, unspecified: Secondary | ICD-10-CM

## 2013-02-14 DIAGNOSIS — Z0000 Encounter for general adult medical examination without abnormal findings: Secondary | ICD-10-CM

## 2013-02-14 DIAGNOSIS — N920 Excessive and frequent menstruation with regular cycle: Secondary | ICD-10-CM

## 2013-02-14 LAB — FERRITIN: Ferritin: 1 ng/mL — ABNORMAL LOW (ref 10–291)

## 2013-02-14 LAB — POCT URINALYSIS DIPSTICK
Bilirubin, UA: NEGATIVE
Blood, UA: NEGATIVE
Leukocytes, UA: NEGATIVE
Nitrite, UA: NEGATIVE
Protein, UA: NEGATIVE
Urobilinogen, UA: NEGATIVE
pH, UA: 5

## 2013-02-14 LAB — CBC WITH DIFFERENTIAL/PLATELET
Basophils Absolute: 0 10*3/uL (ref 0.0–0.1)
Basophils Relative: 1 % (ref 0–1)
Eosinophils Absolute: 0 10*3/uL (ref 0.0–0.7)
Hemoglobin: 9 g/dL — ABNORMAL LOW (ref 12.0–15.0)
MCH: 25.6 pg — ABNORMAL LOW (ref 26.0–34.0)
MCHC: 32 g/dL (ref 30.0–36.0)
Neutro Abs: 2.7 10*3/uL (ref 1.7–7.7)
Neutrophils Relative %: 60 % (ref 43–77)
Platelets: 368 10*3/uL (ref 150–400)
RDW: 15.4 % (ref 11.5–15.5)

## 2013-02-14 NOTE — Progress Notes (Signed)
Chief Complaint  Patient presents with  . Annual Exam    fasting annual exam, sees Dr.McComb and is UTD. No major concerns.    [name] is a 47 y.o. female who presents for a complete physical.  She has no specific concerns.  She has a history of anemia, and admits that she usually only takes iron a month before a doctor's appointment, and she didn't do that this time.  She states that her periods are getting a little heavier. She does have some fatigue.  No dizziness.  Immunization History  Administered Date(s) Administered  . Influenza Whole 03/23/2010  . Tdap 02/10/2011   Last Pap smear: Dr. Arelia Sneddon, last year Last mammogram: 05/2012 Last colonoscopy: 06/2012 Dr. Jarold Motto Last DEXA: 2011 Dentist: yearly Ophtho: yearly Exercise:  3x/week, walk/run and weights  ROS: The patient denies anorexia, fever, weight changes, headaches,  vision changes, decreased hearing, ear pain, sore throat, chest pain, palpitations, dizziness, syncope, dyspnea on exertion, cough, swelling, nausea, vomiting, diarrhea, constipation, abdominal pain, melena, hematochezia, indigestion/heartburn, hematuria, incontinence, dysuria, irregular menstrual cycles (+heavy periods), vaginal discharge, odor or itch, genital lesions, joint pains, numbness, tingling, weakness, tremor, suspicious skin lesions, depression, anxiety, abnormal bleeding/bruising, or enlarged lymph nodes.  She has a known hernia, that remains easily reducible, no pain/discomfort.   Recent breast biopsy--benign No recent problems with allergies or cough Had right knee pain about a month ago.  She was given a medication (she calls "fluid pill", likely NSAID) and pain resolved.  PHYSICAL EXAM: BP 100/70  Pulse 84  Ht 5\' 4"  (1.626 m)  Wt 147 lb (66.679 kg)  BMI 25.22 kg/m2  LMP 02/06/2013    General Appearance:  Alert, cooperative, no distress, appears stated age   Head:  Normocephalic, without obvious abnormality, atraumatic   Eyes:   PERRL, conjunctiva/corneas clear, EOM's intact, fundi  benign   Ears:  Normal TM's and external ear canals   Nose:  Nares normal, mucosa normal, no drainage or sinus tenderness   Throat:  Lips, mucosa, and tongue normal; teeth and gums normal   Neck:  Supple, no lymphadenopathy; thyroid: no enlargement/tenderness/nodules; no carotid  bruit or JVD   Back:  Spine nontender, no curvature, ROM normal, no CVA tenderness   Lungs:  Clear to auscultation bilaterally without wheezes, rales or ronchi; respirations unlabored   Chest Wall:  No tenderness or deformity   Heart:  Regular rate and rhythm, S1 and S2 normal, no murmur, rub  or gallop   Breast Exam:  Deferred to GYN   Abdomen:  Soft, non-tender, nondistended, normoactive bowel sounds,  no masses, no hepatosplenomegaly. LIH hernia--easily reducible   Genitalia:  Deferred to GYN      Extremities:  No clubbing, cyanosis or edema   Pulses:  2+ and symmetric all extremities   Skin:  Skin color, texture, turgor normal, no rashes or lesions   Lymph nodes:  Cervical, supraclavicular, and axillary nodes normal   Neurologic:  CNII-XII intact, normal strength, sensation and gait; reflexes 2+ and symmetric throughout          Psych: Normal mood, affect, hygiene and grooming  ASSESSMENT/PLAN:  Routine general medical examination at a health care facility - Plan: POCT Urinalysis Dipstick, Visual acuity screening, Glucose, random  Need for prophylactic vaccination and inoculation against influenza - Plan: Flu Vaccine QUAD 36+ mos IM  Anemia - Plan: CBC with Differential, Ferritin, TSH, Iron  Heavy menses - Plan: CBC with Differential, TSH  Anemia--Discussed the importance of taking  iron regularly.  Recommended discussion with GYN re: heavy periods.  We briefly reviewed treatment options.  Discussed monthly self breast exams and yearly mammograms after the age of 34; at least 30 minutes of aerobic activity at least 5 days/week; proper sunscreen use  reviewed; healthy diet, including goals of calcium and vitamin D intake and alcohol recommendations (less than or equal to 1 drink/day) reviewed; regular seatbelt use; changing batteries in smoke detectors. Immunization recommendations discussed. Colonoscopy UTD

## 2013-02-14 NOTE — Patient Instructions (Signed)
HEALTH MAINTENANCE RECOMMENDATIONS:  It is recommended that you get at least 30 minutes of aerobic exercise at least 5 days/week (for weight loss, you may need as much as 60-90 minutes). This can be any activity that gets your heart rate up. This can be divided in 10-15 minute intervals if needed, but try and build up your endurance at least once a week.  Weight bearing exercise is also recommended twice weekly.  Eat a healthy diet with lots of vegetables, fruits and fiber.  "Colorful" foods have a lot of vitamins (ie green vegetables, tomatoes, red peppers, etc).  Limit sweet tea, regular sodas and alcoholic beverages, all of which has a lot of calories and sugar.  Up to 1 alcoholic drink daily may be beneficial for women (unless trying to lose weight, watch sugars).  Drink a lot of water.  Calcium recommendations are 1200-1500 mg daily (1500 mg for postmenopausal women or women without ovaries), and vitamin D 1000 IU daily.  This should be obtained from diet and/or supplements (vitamins), and calcium should not be taken all at once, but in divided doses.  Monthly self breast exams and yearly mammograms for women over the age of 52 is recommended.  Sunscreen of at least SPF 30 should be used on all sun-exposed parts of the skin when outside between the hours of 10 am and 4 pm (not just when at beach or pool, but even with exercise, golf, tennis, and yard work!)  Use a sunscreen that says "broad spectrum" so it covers both UVA and UVB rays, and make sure to reapply every 1-2 hours.  Remember to change the batteries in your smoke detectors when changing your clock times in the spring and fall.  Use your seat belt every time you are in a car, and please drive safely and not be distracted with cell phones and texting while driving.   Discuss your heavy periods and anemia with your GYN--there are potential treatments to lessen your bleeding, which will lessen your need for taking iron supplements.

## 2013-02-15 ENCOUNTER — Encounter: Payer: Self-pay | Admitting: Family Medicine

## 2013-02-15 LAB — GLUCOSE, RANDOM: Glucose, Bld: 82 mg/dL (ref 70–99)

## 2013-02-16 ENCOUNTER — Telehealth: Payer: Self-pay | Admitting: *Deleted

## 2013-02-16 ENCOUNTER — Other Ambulatory Visit: Payer: Self-pay | Admitting: *Deleted

## 2013-02-16 DIAGNOSIS — D509 Iron deficiency anemia, unspecified: Secondary | ICD-10-CM

## 2013-02-16 NOTE — Telephone Encounter (Signed)
Patient advised, rx called into [facility] [address] and scheduled her for 05/23/13 for labs. Future orders entered.

## 2013-02-16 NOTE — Telephone Encounter (Signed)
Error

## 2013-02-18 ENCOUNTER — Telehealth: Payer: Self-pay | Admitting: Internal Medicine

## 2013-02-18 MED ORDER — POLYSACCHARIDE IRON COMPLEX 150 MG PO CAPS: 150.0000 mg | ORAL_CAPSULE | Freq: Two times a day (BID) | ORAL | Status: DC

## 2013-02-18 NOTE — Telephone Encounter (Signed)
Pt called and said pharmacy never got med. Sent it into pharmacy

## 2013-05-23 ENCOUNTER — Other Ambulatory Visit: Payer: 59

## 2013-05-23 DIAGNOSIS — D509 Iron deficiency anemia, unspecified: Secondary | ICD-10-CM

## 2013-05-23 LAB — CBC WITH DIFFERENTIAL/PLATELET
Basophils Absolute: 0 10*3/uL (ref 0.0–0.1)
Eosinophils Absolute: 0.1 10*3/uL (ref 0.0–0.7)
Eosinophils Relative: 2 % (ref 0–5)
HCT: 32.5 % — ABNORMAL LOW (ref 36.0–46.0)
Lymphocytes Relative: 28 % (ref 12–46)
MCH: 24.3 pg — ABNORMAL LOW (ref 26.0–34.0)
MCV: 81.5 fL (ref 78.0–100.0)
Monocytes Absolute: 0.3 10*3/uL (ref 0.1–1.0)
Platelets: 339 10*3/uL (ref 150–400)
RDW: 16.1 % — ABNORMAL HIGH (ref 11.5–15.5)
WBC: 4.6 10*3/uL (ref 4.0–10.5)

## 2013-08-05 ENCOUNTER — Encounter (HOSPITAL_BASED_OUTPATIENT_CLINIC_OR_DEPARTMENT_OTHER): Payer: Self-pay | Admitting: *Deleted

## 2013-08-10 ENCOUNTER — Encounter (HOSPITAL_BASED_OUTPATIENT_CLINIC_OR_DEPARTMENT_OTHER): Payer: Self-pay | Admitting: *Deleted

## 2013-08-10 NOTE — H&P (Signed)
  Patient name [name], [name] DICTATION# [id number] CSN# [id number]  Darlyn Chamber, MD 08/10/2013 12:14 PM

## 2013-08-11 NOTE — H&P (Signed)
NAME:  [name], [name]             ACCOUNT NO.:  [id number]  MEDICAL RECORD NO.:  [id number]  LOCATION:                                 FACILITY:  PHYSICIAN:  Darlyn Chamber, M.D.   DATE OF BIRTH:  1966-04-01  DATE OF ADMISSION: DATE OF DISCHARGE:                             HISTORY & PHYSICAL   The patient is a 48 year old, gravida 3, para 3, married female who presents for dilation and curettage along with hysteroscopy and resection of endometrial polyps.  The patient has been having trouble with increasing menorrhagia.  Her hemoglobin had been depressed, saline infusion.  Ultrasound revealed multiple endometrial polyps along with uterine fibroids.  She now presents for hysteroscopic resection of the endometrial polyps.  ALLERGIES:  In terms of allergies, she has no known drug allergies.  MEDICATIONS:  Iron pills.  PAST MEDICAL HISTORY:  Usual childhood disease.  No sequelae.  SURGICAL HISTORY:  She has had 3 cesarean sections with the last one, did have her bilateral tubal ligation.  Also a history of inguinal hernia.  She is seeing a Psychologist, sport and exercise for this, right now they have been watching conservatively.  FAMILY HISTORY:  Noncontributory.  SOCIAL HISTORY:  Reveals no tobacco, alcohol use.  REVIEW OF SYSTEMS:  Noncontributory.  PHYSICAL EXAMINATION:  VITAL SIGNS:  The patient is afebrile.  Stable vital signs. HEENT:  Patient is normocephalic.  Pupils equal, round, and reactive to light and accommodation.  Extraocular movements are intact.  Sclerae and conjunctivae clear.  Oropharynx clear. NECK:  Without thyromegaly. BREASTS:  Not examined. LUNGS:  Clear. CARDIOVASCULAR SYSTEM:  Regular rate.  No murmurs, rubs, or gallops. ABDOMEN:  Benign.  IMPRESSION: 1. Menorrhagia with associated endometrial polyps. 2. Anemia.  PLAN:  The patient to undergo hysteroscopy D and C.  The risks have been discussed including the risk of infection.  Risk of vascular injury could  lead to hemorrhage requiring transfusion with the risk of AIDS or hepatitis, excessive bleeding could require hysterectomy.  Risk of perforation with injury to adjacent organs such as bowel that could require for exploratory surgery.  Risk of deep venous thrombosis and pulmonary embolus.  The patient expressed understanding of indications and risks.     Darlyn Chamber, M.D.     JSM/MEDQ  D:  08/10/2013  T:  08/10/2013  Job:  [id number]

## 2013-08-12 ENCOUNTER — Encounter (HOSPITAL_BASED_OUTPATIENT_CLINIC_OR_DEPARTMENT_OTHER): Payer: Self-pay | Admitting: *Deleted

## 2013-08-12 NOTE — Progress Notes (Signed)
NPO AFTER MN.  PT POSSIBLY MAY COME TO GET LAB WORK DONE TODAY BY 1500.  IF SO SHE WILL ARRIVE AT 0600 IF NOT WILL ARRIVE AT 0530 TO GET LAB WORK DONE.

## 2013-08-14 NOTE — Anesthesia Preprocedure Evaluation (Addendum)
Anesthesia Evaluation  Patient identified by MRN, date of birth, ID band Patient awake    Reviewed: Allergy & Precautions, H&P , NPO status , Patient's Chart, lab work & pertinent test results, reviewed documented beta blocker date and time   Airway Mallampati: II TM Distance: >3 FB Neck ROM: full    Dental  (+) Teeth Intact, Loose,    Pulmonary neg pulmonary ROS,  breath sounds clear to auscultation        Cardiovascular negative cardio ROS  Rhythm:regular     Neuro/Psych negative neurological ROS  negative psych ROS   GI/Hepatic negative GI ROS, Neg liver ROS,   Endo/Other  negative endocrine ROS  Renal/GU negative Renal ROS  negative genitourinary   Musculoskeletal   Abdominal   Peds  Hematology  (+) anemia ,   Anesthesia Other Findings See surgeon's H&P   Reproductive/Obstetrics negative OB ROS                          Anesthesia Physical Anesthesia Plan  ASA: II  Anesthesia Plan: General   Post-op Pain Management:    Induction: Intravenous  Airway Management Planned: LMA  Additional Equipment:   Intra-op Plan:   Post-operative Plan: Extubation in OR  Informed Consent: I have reviewed the patients History and Physical, chart, labs and discussed the procedure including the risks, benefits and alternatives for the proposed anesthesia with the patient or authorized representative who has indicated his/her understanding and acceptance.   Dental advisory given  Plan Discussed with: CRNA  Anesthesia Plan Comments:         Anesthesia Quick Evaluation

## 2013-08-15 ENCOUNTER — Encounter (HOSPITAL_BASED_OUTPATIENT_CLINIC_OR_DEPARTMENT_OTHER): Payer: 59 | Admitting: Anesthesiology

## 2013-08-15 ENCOUNTER — Encounter (HOSPITAL_BASED_OUTPATIENT_CLINIC_OR_DEPARTMENT_OTHER): Payer: Self-pay | Admitting: *Deleted

## 2013-08-15 ENCOUNTER — Ambulatory Visit (HOSPITAL_BASED_OUTPATIENT_CLINIC_OR_DEPARTMENT_OTHER)
Admission: RE | Admit: 2013-08-15 | Discharge: 2013-08-15 | Disposition: A | Discharge status: Home / Self Care | Payer: 59 | Source: Ambulatory Visit | Attending: Obstetrics and Gynecology | Admitting: Obstetrics and Gynecology

## 2013-08-15 ENCOUNTER — Ambulatory Visit (HOSPITAL_BASED_OUTPATIENT_CLINIC_OR_DEPARTMENT_OTHER): Payer: 59 | Admitting: Anesthesiology

## 2013-08-15 ENCOUNTER — Encounter (HOSPITAL_BASED_OUTPATIENT_CLINIC_OR_DEPARTMENT_OTHER)
Admission: RE | Disposition: A | Discharge status: Home / Self Care | Payer: Self-pay | Source: Ambulatory Visit | Attending: Obstetrics and Gynecology

## 2013-08-15 DIAGNOSIS — N840 Polyp of corpus uteri: Secondary | ICD-10-CM

## 2013-08-15 HISTORY — PX: DILATATION & CURRETTAGE/HYSTEROSCOPY WITH RESECTOCOPE: SHX5572

## 2013-08-15 HISTORY — DX: Iron deficiency anemia, unspecified: D50.9

## 2013-08-15 HISTORY — DX: Presence of spectacles and contact lenses: Z97.3

## 2013-08-15 HISTORY — DX: Polyp of corpus uteri: N84.0

## 2013-08-15 LAB — CBC
HEMATOCRIT: 28.1 % — AB (ref 36.0–46.0)
Hemoglobin: 8.4 g/dL — ABNORMAL LOW (ref 12.0–15.0)
MCH: 25.8 pg — ABNORMAL LOW (ref 26.0–34.0)
MCHC: 29.9 g/dL — ABNORMAL LOW (ref 30.0–36.0)
MCV: 86.5 fL (ref 78.0–100.0)
PLATELETS: 258 10*3/uL (ref 150–400)
RBC: 3.25 MIL/uL — ABNORMAL LOW (ref 3.87–5.11)
RDW: 17.9 % — AB (ref 11.5–15.5)
WBC: 2.8 10*3/uL — AB (ref 4.0–10.5)

## 2013-08-15 LAB — HCG, SERUM, QUALITATIVE: PREG SERUM: NEGATIVE

## 2013-08-15 SURGERY — DILATATION & CURETTAGE/HYSTEROSCOPY WITH RESECTOCOPE
Anesthesia: General | Site: Uterus

## 2013-08-15 MED ORDER — ACETAMINOPHEN 10 MG/ML IV SOLN
INTRAVENOUS | Status: DC | PRN
  Administered 2013-08-15: 1000 mg via INTRAVENOUS

## 2013-08-15 MED ORDER — PROMETHAZINE HCL 25 MG/ML IJ SOLN
6.2500 mg | INTRAMUSCULAR | Status: DC | PRN
  Filled 2013-08-15: qty 1

## 2013-08-15 MED ORDER — MIDAZOLAM HCL 2 MG/2ML IJ SOLN
INTRAMUSCULAR | Status: AC
  Filled 2013-08-15: qty 2

## 2013-08-15 MED ORDER — LACTATED RINGERS IV SOLN
INTRAVENOUS | Status: DC
  Filled 2013-08-15: qty 1000

## 2013-08-15 MED ORDER — LACTATED RINGERS IV SOLN
INTRAVENOUS | Status: DC
  Administered 2013-08-15 (×2): via INTRAVENOUS
  Filled 2013-08-15: qty 1000

## 2013-08-15 MED ORDER — LIDOCAINE HCL (CARDIAC) 20 MG/ML IV SOLN
INTRAVENOUS | Status: DC | PRN
  Administered 2013-08-15: 50 mg via INTRAVENOUS

## 2013-08-15 MED ORDER — LIDOCAINE-EPINEPHRINE 1 %-1:100000 IJ SOLN
INTRAMUSCULAR | Status: DC | PRN
  Administered 2013-08-15: 10 mL

## 2013-08-15 MED ORDER — MIDAZOLAM HCL 5 MG/5ML IJ SOLN
INTRAMUSCULAR | Status: DC | PRN
  Administered 2013-08-15: 2 mg via INTRAVENOUS

## 2013-08-15 MED ORDER — CEFAZOLIN SODIUM-DEXTROSE 2-3 GM-% IV SOLR
2.0000 g | INTRAVENOUS | Status: AC
  Administered 2013-08-15: 2 g via INTRAVENOUS
  Filled 2013-08-15: qty 50

## 2013-08-15 MED ORDER — GLYCINE 1.5 % IR SOLN
Status: DC | PRN
  Administered 2013-08-15: 3000 mL

## 2013-08-15 MED ORDER — FENTANYL CITRATE 0.05 MG/ML IJ SOLN
INTRAMUSCULAR | Status: AC
  Filled 2013-08-15: qty 4

## 2013-08-15 MED ORDER — HYDROMORPHONE HCL PF 1 MG/ML IJ SOLN
0.2500 mg | INTRAMUSCULAR | Status: DC | PRN
  Filled 2013-08-15: qty 1

## 2013-08-15 MED ORDER — EPHEDRINE SULFATE 50 MG/ML IJ SOLN
INTRAMUSCULAR | Status: DC | PRN
  Administered 2013-08-15: 10 mg via INTRAVENOUS

## 2013-08-15 MED ORDER — PROPOFOL 10 MG/ML IV BOLUS
INTRAVENOUS | Status: DC | PRN
  Administered 2013-08-15: 150 mg via INTRAVENOUS

## 2013-08-15 MED ORDER — FENTANYL CITRATE 0.05 MG/ML IJ SOLN
INTRAMUSCULAR | Status: DC | PRN
  Administered 2013-08-15: 100 ug via INTRAVENOUS

## 2013-08-15 MED ORDER — DEXAMETHASONE SODIUM PHOSPHATE 4 MG/ML IJ SOLN
INTRAMUSCULAR | Status: DC | PRN
  Administered 2013-08-15: 10 mg via INTRAVENOUS

## 2013-08-15 SURGICAL SUPPLY — 29 items
CANISTER SUCTION 2500CC (MISCELLANEOUS) ×2 IMPLANT
CATH ROBINSON RED A/P 16FR (CATHETERS) IMPLANT
CLOTH BEACON ORANGE TIMEOUT ST (SAFETY) ×2 IMPLANT
CORD ACTIVE DISPOSABLE (ELECTRODE)
CORD ELECTRO ACTIVE DISP (ELECTRODE) IMPLANT
COVER TABLE BACK 60X90 (DRAPES) ×2 IMPLANT
DRAPE CAMERA CLOSED 9X96 (DRAPES) ×2 IMPLANT
DRAPE LG THREE QUARTER DISP (DRAPES) ×2 IMPLANT
DRESSING TELFA 8X3 (GAUZE/BANDAGES/DRESSINGS) ×2 IMPLANT
ELECT LOOP GYNE PRO 24FR (CUTTING LOOP) ×2
ELECT REM PT RETURN 9FT ADLT (ELECTROSURGICAL) ×2
ELECT VAPORTRODE GRVD BAR (ELECTRODE) IMPLANT
ELECTRODE LOOP GYNE PRO 24FR (CUTTING LOOP) ×1 IMPLANT
ELECTRODE REM PT RTRN 9FT ADLT (ELECTROSURGICAL) ×1 IMPLANT
GLOVE BIO SURGEON STRL SZ7 (GLOVE) ×6 IMPLANT
GLOVE ECLIPSE 7.5 STRL STRAW (GLOVE) ×2 IMPLANT
GLOVE INDICATOR 7.5 STRL GRN (GLOVE) ×2 IMPLANT
GOWN PREVENTION PLUS LG XLONG (DISPOSABLE) ×4 IMPLANT
GOWN STRL REUS W/ TWL XL LVL3 (GOWN DISPOSABLE) ×2 IMPLANT
GOWN STRL REUS W/TWL XL LVL3 (GOWN DISPOSABLE) ×2
LEGGING LITHOTOMY PAIR STRL (DRAPES) ×2 IMPLANT
NEEDLE SPNL 22GX3.5 QUINCKE BK (NEEDLE) ×2 IMPLANT
PACK BASIN DAY SURGERY FS (CUSTOM PROCEDURE TRAY) ×2 IMPLANT
PAD OB MATERNITY 4.3X12.25 (PERSONAL CARE ITEMS) ×2 IMPLANT
PAD PREP 24X48 CUFFED NSTRL (MISCELLANEOUS) ×2 IMPLANT
SYR CONTROL 10ML LL (SYRINGE) ×2 IMPLANT
TOWEL OR 17X24 6PK STRL BLUE (TOWEL DISPOSABLE) ×4 IMPLANT
TUBING HYDROFLEX HYSTEROSCOPY (TUBING) ×2 IMPLANT
WATER STERILE IRR 500ML POUR (IV SOLUTION) ×2 IMPLANT

## 2013-08-15 NOTE — Discharge Instructions (Signed)

## 2013-08-15 NOTE — Brief Op Note (Signed)
08/15/2013  8:04 AM  PATIENT:  [name]  48 y.o. female  PRE-OPERATIVE DIAGNOSIS:  polyps  POST-OPERATIVE DIAGNOSIS:  endometrial polyps  PROCEDURE:  Procedure(s): DILATATION & CURETTAGE/HYSTEROSCOPY WITH RESECTOCOPE (N/A)  SURGEON:  Surgeon(s) and Role:    * Darlyn Chamber, MD - Primary  PHYSICIAN ASSISTANT:   ASSISTANTS: none   ANESTHESIA:   general and paracervical block  EBL:  Total I/O In: 700 [I.V.:700] Out: -   BLOOD ADMINISTERED:none  DRAINS: none   LOCAL MEDICATIONS USED:  XYLOCAINE   SPECIMEN:  Source of Specimen:  endometrial polyps and currettings  DISPOSITION OF SPECIMEN:  PATHOLOGY  COUNTS:  YES  TOURNIQUET:  * No tourniquets in log *  DICTATION: .Dragon Dictation  PLAN OF CARE: Discharge to home after PACU  PATIENT DISPOSITION:  PACU - hemodynamically stable.   Delay start of Pharmacological VTE agent (>24hrs) due to surgical blood loss or risk of bleeding: not applicable

## 2013-08-15 NOTE — Anesthesia Procedure Notes (Signed)
Procedure Name: LMA Insertion Date/Time: 08/15/2013 7:35 AM Performed by: Bethena Roys T Pre-anesthesia Checklist: Patient identified, Emergency Drugs available, Suction available and Patient being monitored Patient Re-evaluated:Patient Re-evaluated prior to inductionOxygen Delivery Method: Circle System Utilized Preoxygenation: Pre-oxygenation with 100% oxygen Intubation Type: IV induction Ventilation: Mask ventilation without difficulty LMA: LMA inserted LMA Size: 4.0 Number of attempts: 1 Airway Equipment and Method: bite block Placement Confirmation: positive ETCO2 Dental Injury: Teeth and Oropharynx as per pre-operative assessment

## 2013-08-15 NOTE — Anesthesia Postprocedure Evaluation (Signed)
Anesthesia Post Note  Patient: [name]  Procedure(s) Performed: Procedure(s) (LRB): DILATATION & CURETTAGE/HYSTEROSCOPY WITH RESECTOCOPE (N/A)  Anesthesia type: General  Patient location: PACU  Post pain: Pain level controlled  Post assessment: Post-op Vital signs reviewed  Last Vitals:  Filed Vitals:   08/15/13 0959  BP: 114/76  Pulse: 75  Temp: 36.1 C  Resp: 18    Post vital signs: Reviewed  Level of consciousness: sedated  Complications: No apparent anesthesia complications

## 2013-08-15 NOTE — Transfer of Care (Signed)
Immediate Anesthesia Transfer of Care Note  Patient: [name]  Procedure(s) Performed: Procedure(s): DILATATION & CURETTAGE/HYSTEROSCOPY WITH RESECTOCOPE (N/A)  Patient Location: PACU  Anesthesia Type:General  Level of Consciousness: sedated and responds to stimulation  Airway & Oxygen Therapy: Patient Spontanous Breathing and Patient connected to nasal cannula oxygen  Post-op Assessment: Report given to PACU RN  Post vital signs: Reviewed and stable  Complications: No apparent anesthesia complications

## 2013-08-15 NOTE — Op Note (Signed)
Preoperative diagnosis:  Endometrial polyps.  Postoperative diagnosis:  The same  Procedure: Paracervical block. Cervical dilation. Hysteroscopic resection of endometrial polyps. Endometrial curettings.  Surgeon: Arvella Nigh  Anesthesia: Paracervical block with general anesthesia.  Estimated blood loss was minimal.  Packs and drains were none.  Complications were none.  Indications were as dictated history and physical.  Procedure: Patient taken the OR and placed the supine position with the Allen stirrups. Perineum and vagina were prepped out Betadine and draped as a sterile field. That was placed the vaginal vault. Cervix was grasped with a single-tooth tenaculum. A difficulty trying to sound the intrauterine cavity. To begin dilated the ectocervix. Finally were able to insert a uterine sound determine the proper direction of the endometrial cavity. Within dilated to a 33 Pratt dilator. The scope was introduced into intrauterine cavity and the intrauterine cavity was distended using glycine numerous endometrial polyps were noted. His were all resected using the resectoscope and sent for pathological review. At the end of the procedure there was no polyps present. Gravid minimal bleeding. Total deficit was 215 cc of glycine. Next endometrial curettings were also obtained and sent for pathology. At this point time the single-tooth tenaculum speculum were removed from the vaginal cavity. The patient taken out of dorsal lithotomy position. Is alert transferred recovery room in good condition. Sponge instrument needle count prescribed by circulating nurse x2.

## 2013-08-15 NOTE — H&P (Signed)
  History and physical exam unchanged 

## 2013-08-16 ENCOUNTER — Encounter (HOSPITAL_BASED_OUTPATIENT_CLINIC_OR_DEPARTMENT_OTHER): Payer: Self-pay | Admitting: Obstetrics and Gynecology

## 2013-11-02 ENCOUNTER — Encounter: Payer: Self-pay | Admitting: Family Medicine

## 2013-11-02 ENCOUNTER — Ambulatory Visit (INDEPENDENT_AMBULATORY_CARE_PROVIDER_SITE_OTHER): Payer: 59 | Admitting: Family Medicine

## 2013-11-02 VITALS — BP 102/70 | HR 80 | Temp 98.8°F | Ht 64.0 in | Wt 146.0 lb

## 2013-11-02 DIAGNOSIS — J309 Allergic rhinitis, unspecified: Secondary | ICD-10-CM | POA: Insufficient documentation

## 2013-11-02 NOTE — Patient Instructions (Signed)
  Drink plenty of fluids Continue claritin every day Taking guaifenesin (expectorant that keeps the mucus clear--found in Mucinex and others) and decongestant (ie phenylephrine or pseudoephedrine--ie sudafed) will help with the cough and the sinus pressure.  You can get a combination form of Mucinex-D. Dextromethorphan is a cough suppressant (found in Delsym syrup, and anything that says DM).  Options include Mucinex-D plus delsym syrup, vs Mucinex DM plus a separate Sudafed. Along with the daily claritin (plain, NOT the D version)  Consider adding Flonase nasal spray if allergies aren't improving with these measures (available over-the-counter now).  If helpful, use early on in the season in the future (at first onset of allergy symptoms, and use daily for the whole season).   If you start with fevers, increasing sinus pain with significantly discolored mucus or phlegm, call the office for antibiotic prescription, or return for recheck.

## 2013-11-02 NOTE — Progress Notes (Signed)
Chief Complaint  Patient presents with  . Cough    since Sunday has been having sinus HA, nasal congestion and some body aches. No fevers.    She started coughing about 6 days ago.  She thought it was allergies, started taking claritin.  She had been sneezing when she was outside.  She started with headaches 3 days ago.  Headaches are at both temples, and across her forehead and both cheeks.  Mucus is clear.  Today her cough has been dry, had been more productive on other days, slightly yellow.  Denies any shortness of breath.  Her chest is a little sore--?from coughing.  She is starting to lose her voice today. +itchy, water eyes.  Denies sick contacts.  Denies fevers.  She has had some chills.  Denies nausea, vomiting, diarrhea.  She has been feeling achey, but her menstrual cycle started today also.  She took Theraflu one night, claritin for the last 2 days.  She took robitussin PM last night--she slept well.   Past Medical History  Diagnosis Date  . Endometrial polyp   . Iron deficiency anemia   . Wears contact lenses    Past Surgical History  Procedure Laterality Date  . Cesarean section   1988/  02-13-2003/   06-27-2004    LAST C/S  W/ BILATERAL TUBAL LIGATION  . Breast biopsy Right 08/13/2012    Procedure: BREAST BIOPSY WITH NEEDLE LOCALIZATION;  Surgeon: Edward Jolly, MD;  Location: [facility];  Service: General;  Laterality: Right;   lobular neoplasia  . Dilatation & currettage/hysteroscopy with resectocope N/A 08/15/2013    Procedure: DILATATION & CURETTAGE/HYSTEROSCOPY WITH RESECTOCOPE;  Surgeon: Darlyn Chamber, MD;  Location: [facility];  Service: Gynecology;  Laterality: N/A;   History   Social History  . Marital Status: Married    Spouse Name: N/A    Number of Children: 3  . Years of Education: N/A   Occupational History  . hair dresser    Social History Main Topics  . Smoking status: Never Smoker   . Smokeless tobacco: Never  Used  . Alcohol Use: No  . Drug Use: No  . Sexual Activity: Not on file   Other Topics Concern  . Not on file   Social History Narrative   Lives with husband, 2 sons (oldest son is grown, is in prison)    Outpatient Encounter Prescriptions as of 11/02/2013  Medication Sig Note  . ferrous fumarate (HEMOCYTE - 106 MG FE) 325 (106 FE) MG TABS tablet Take 1 tablet by mouth daily.   . Multiple Vitamins-Minerals (MULTIVITAMIN WITH MINERALS) tablet Take 1 tablet by mouth daily.     . naproxen sodium (ANAPROX) 220 MG tablet Take 220 mg by mouth as needed.    . Pseudoeph-Chlorphen-DM (ROBITUSSIN PM PO) Take 2 capsules by mouth as needed.   . Chlorphen-Pseudoephed-APAP (THERAFLU FLU/COLD PO) Take 1 packet by mouth as needed. 11/02/2013: Took is a couple of days ago, once   No Known Allergies  ROS:  Denies fevers, chills, nausea, vomiting, diarrhea, bleeding/bruising, rashes, urinary complaints, shortness of breath.  See HPI.  PHYSICAL EXAM: BP 102/70  Pulse 80  Temp(Src) 98.8 F (37.1 C) (Oral)  Ht 5\' 4"  (1.626 m)  Wt 146 lb (66.225 kg)  BMI 25.05 kg/m2  LMP 11/02/2013  Well developed, pleasant female in no distress.  No coughing during visit HEENT:  PERRL, EOMI, conjunctiva is clear.  TM's and EAC's are normal. Nasal mucosa  is moderately edematous, no erythema, clear mucus.  Sinuses are nontender.  OP is clear Neck: no lymphadenopathy or mass Heart: regular rate and rhythm without murmur Lungs: clear bilaterally  ASSESSMENT/PLAN:  Allergic rhinitis, cause unspecified - with some sinus pressure, but no evidence of infection. Supportive measures reviewed in detail  Drink plenty of fluids Continue claritin every day Taking guaifenesin (expectorant that keeps the mucus clear--found in Mucinex and others) and decongestant (ie phenylephrine or pseudoephedrine--ie sudafed) will help with the cough and the sinus pressure.  You can get a combination form of Mucinex-D. Dextromethorphan is a  cough suppressant (found in Delsym syrup, and anything that says DM).  Options include Mucinex-D plus delsym syrup, vs Mucinex DM plus a separate Sudafed. Along with the daily claritin (plain, NOT the D version)  Consider adding Flonase nasal spray if allergies aren't improving with these measures (available over-the-counter now).  If helpful, use early on in the season in the future (at first onset of allergy symptoms, and use daily for the whole season). Consider sinus rinses  F/u prn persistent/worsening symptoms

## 2013-11-21 ENCOUNTER — Telehealth: Payer: Self-pay | Admitting: Family Medicine

## 2013-11-21 DIAGNOSIS — J329 Chronic sinusitis, unspecified: Secondary | ICD-10-CM

## 2013-11-21 MED ORDER — AZITHROMYCIN 250 MG PO TABS: ORAL_TABLET | ORAL | Status: DC

## 2013-11-21 NOTE — Telephone Encounter (Signed)
Spoke with patient and her mucus is definetely discolored, yellow to green. No fevers. Has increased ear pain. She thinks it is a sinus infection. Sent rx for z pak.

## 2013-11-21 NOTE — Telephone Encounter (Signed)
We had talked about adding Flonase if ongoing allergy symptoms.  Please confirm with pt if simply allergies and congestion (mucus is clear/white, no fever), vs if she has developed a sinus infection (increased pain, yellow-green mucus).  If allergies, okay to rx fluticasone (generic flonase) x 6 mos.  If infection, okay for z-pak

## 2013-11-21 NOTE — Telephone Encounter (Signed)
Pt called and stated that the sinus symptoms she last saw you for have returned. Pt states she was informed as last OV if symptoms returned to call in. Pt uses [facility] [address]. Pt can be reached at [phone].

## 2014-04-24 ENCOUNTER — Encounter: Payer: Self-pay | Admitting: Family Medicine

## 2014-07-13 ENCOUNTER — Other Ambulatory Visit: Payer: Self-pay | Admitting: Obstetrics and Gynecology

## 2014-07-13 DIAGNOSIS — R928 Other abnormal and inconclusive findings on diagnostic imaging of breast: Secondary | ICD-10-CM

## 2014-07-24 ENCOUNTER — Ambulatory Visit
Admission: RE | Admit: 2014-07-24 | Discharge: 2014-07-24 | Disposition: A | Discharge status: Home / Self Care | Payer: 59 | Source: Ambulatory Visit | Attending: Obstetrics and Gynecology | Admitting: Obstetrics and Gynecology

## 2014-07-24 DIAGNOSIS — R928 Other abnormal and inconclusive findings on diagnostic imaging of breast: Secondary | ICD-10-CM

## 2014-11-28 ENCOUNTER — Encounter: Payer: Self-pay | Admitting: Gastroenterology

## 2015-02-01 ENCOUNTER — Ambulatory Visit (INDEPENDENT_AMBULATORY_CARE_PROVIDER_SITE_OTHER): Payer: 59 | Admitting: Family Medicine

## 2015-02-01 ENCOUNTER — Encounter: Payer: Self-pay | Admitting: Family Medicine

## 2015-02-01 ENCOUNTER — Telehealth: Payer: Self-pay | Admitting: Family Medicine

## 2015-02-01 ENCOUNTER — Emergency Department (HOSPITAL_COMMUNITY)
Admission: EM | Admit: 2015-02-01 | Discharge: 2015-02-02 | Disposition: A | Discharge status: Home / Self Care | Payer: 59 | Attending: Emergency Medicine | Admitting: Emergency Medicine

## 2015-02-01 ENCOUNTER — Encounter (HOSPITAL_COMMUNITY): Payer: Self-pay

## 2015-02-01 VITALS — BP 112/70 | HR 99 | Temp 98.7°F | Ht 65.0 in | Wt 149.0 lb

## 2015-02-01 DIAGNOSIS — D509 Iron deficiency anemia, unspecified: Secondary | ICD-10-CM | POA: Insufficient documentation

## 2015-02-01 DIAGNOSIS — K5900 Constipation, unspecified: Secondary | ICD-10-CM | POA: Diagnosis not present

## 2015-02-01 DIAGNOSIS — R5383 Other fatigue: Secondary | ICD-10-CM | POA: Diagnosis not present

## 2015-02-01 DIAGNOSIS — R1030 Lower abdominal pain, unspecified: Secondary | ICD-10-CM

## 2015-02-01 DIAGNOSIS — Z9851 Tubal ligation status: Secondary | ICD-10-CM | POA: Insufficient documentation

## 2015-02-01 DIAGNOSIS — D649 Anemia, unspecified: Secondary | ICD-10-CM

## 2015-02-01 DIAGNOSIS — Z8742 Personal history of other diseases of the female genital tract: Secondary | ICD-10-CM | POA: Diagnosis not present

## 2015-02-01 DIAGNOSIS — R1031 Right lower quadrant pain: Secondary | ICD-10-CM | POA: Diagnosis present

## 2015-02-01 DIAGNOSIS — Z8349 Family history of other endocrine, nutritional and metabolic diseases: Secondary | ICD-10-CM | POA: Diagnosis not present

## 2015-02-01 DIAGNOSIS — Z79899 Other long term (current) drug therapy: Secondary | ICD-10-CM | POA: Insufficient documentation

## 2015-02-01 DIAGNOSIS — K5730 Diverticulosis of large intestine without perforation or abscess without bleeding: Secondary | ICD-10-CM

## 2015-02-01 DIAGNOSIS — R1900 Intra-abdominal and pelvic swelling, mass and lump, unspecified site: Secondary | ICD-10-CM

## 2015-02-01 DIAGNOSIS — Z83438 Family history of other disorder of lipoprotein metabolism and other lipidemia: Secondary | ICD-10-CM

## 2015-02-01 LAB — COMPREHENSIVE METABOLIC PANEL
ALBUMIN: 4 g/dL (ref 3.6–5.1)
ALT: 8 U/L (ref 6–29)
AST: 14 U/L (ref 10–35)
Alkaline Phosphatase: 36 U/L (ref 33–115)
BUN: 17 mg/dL (ref 7–25)
CALCIUM: 9.2 mg/dL (ref 8.6–10.2)
CHLORIDE: 105 mmol/L (ref 98–110)
CO2: 24 mmol/L (ref 20–31)
Creat: 0.81 mg/dL (ref 0.50–1.10)
Glucose, Bld: 106 mg/dL — ABNORMAL HIGH (ref 65–99)
POTASSIUM: 3.8 mmol/L (ref 3.5–5.3)
Sodium: 138 mmol/L (ref 135–146)
Total Bilirubin: 0.7 mg/dL (ref 0.2–1.2)
Total Protein: 6.9 g/dL (ref 6.1–8.1)

## 2015-02-01 LAB — POCT URINALYSIS DIPSTICK
Blood, UA: NEGATIVE
Glucose, UA: NEGATIVE
Leukocytes, UA: NEGATIVE
Nitrite, UA: NEGATIVE
UROBILINOGEN UA: NEGATIVE
pH, UA: 5.5

## 2015-02-01 LAB — IRON: Iron: <10 ug/dL — ABNORMAL LOW (ref 42–145)

## 2015-02-01 LAB — HEMOCCULT GUIAC POC 1CARD (OFFICE)
Card #2 Fecal Occult Blod, POC: NEGATIVE
Card #3 Fecal Occult Blood, POC: NEGATIVE
FECAL OCCULT BLD: NEGATIVE

## 2015-02-01 LAB — CBC WITH DIFFERENTIAL/PLATELET
BASOS ABS: 0 10*3/uL (ref 0.0–0.1)
BASOS PCT: 0 % (ref 0–1)
Eosinophils Absolute: 0 10*3/uL (ref 0.0–0.7)
Eosinophils Relative: 0 % (ref 0–5)
HCT: 28.5 % — ABNORMAL LOW (ref 36.0–46.0)
Hemoglobin: 9.2 g/dL — ABNORMAL LOW (ref 12.0–15.0)
Lymphocytes Relative: 3 % — ABNORMAL LOW (ref 12–46)
Lymphs Abs: 0.6 10*3/uL — ABNORMAL LOW (ref 0.7–4.0)
MCH: 26.4 pg (ref 26.0–34.0)
MCHC: 32.3 g/dL (ref 30.0–36.0)
MCV: 81.7 fL (ref 78.0–100.0)
MONO ABS: 0.4 10*3/uL (ref 0.1–1.0)
MPV: 11.1 fL (ref 8.6–12.4)
Monocytes Relative: 2 % — ABNORMAL LOW (ref 3–12)
NEUTROS ABS: 19.1 10*3/uL — AB (ref 1.7–7.7)
Neutrophils Relative %: 95 % — ABNORMAL HIGH (ref 43–77)
PLATELETS: 261 10*3/uL (ref 150–400)
RBC: 3.49 MIL/uL — AB (ref 3.87–5.11)
RDW: 16.3 % — ABNORMAL HIGH (ref 11.5–15.5)
WBC: 20.1 10*3/uL — AB (ref 4.0–10.5)

## 2015-02-01 LAB — TSH: TSH: 0.918 u[IU]/mL (ref 0.350–4.500)

## 2015-02-01 LAB — LIPID PANEL
Cholesterol: 134 mg/dL (ref 125–200)
HDL: 88 mg/dL (ref >=46)
LDL Cholesterol: 41 mg/dL (ref <130)
Total CHOL/HDL Ratio: 1.5 Ratio (ref <=5.0)
Triglycerides: 26 mg/dL (ref <150)
VLDL: 5 mg/dL (ref <30)

## 2015-02-01 LAB — FERRITIN: FERRITIN: 16 ng/mL (ref 10–291)

## 2015-02-01 NOTE — Telephone Encounter (Signed)
Attempted to call patient number but unable to contact her so I called her emergency contact. Spoke with patient and she is not feeling any better, continues to have chills and RLQ pain. After reviewing her lab results she has a WBC of 20.1 with neutrophils 95%. Recommended that she go to the ED for further evaluation. She states she will got to [facility] ED. I called [facility] ED and spoke with charge nurse Tiffany to alert her of patients upcoming arrival.

## 2015-02-01 NOTE — Patient Instructions (Signed)
Take laxative and try to have a bowel movement. See if this helps with your abdominal pain. We will notify you once your labs are back, most likely in the morning. Please call the office if you haven't heard from [place] by 2pm.

## 2015-02-01 NOTE — Progress Notes (Signed)
Subjective:    Patient ID: [name], female    DOB: 22-Jun-1966, 49 y.o.   MRN: [id number]  HPI Pt here with complaints of lower abdominal pain that started with chills last night and the pain wraps around to the low back. She states she also felt achy last evening and had a headache that was relieved with Aleve. Reports feeling tired and not having normal energy level for past month.  Denies history of UTI, kidney stones, or STDs. No recent injury or change in activities. She complains of history of constipation and states this feels similar to being constipated. Her last bowel movement was 3 days ago and it was brown loose stool due to drinking magnesium citrate. Also complains of urine being dark. Denies urinary frequency, urgency, or dysuria.  Last menstrual cycle was July 28th and reports heavy bleeding for 3 days. Has had a tubal ligation in the past.  States she had a colonoscopy 2 years ago and was told she had diverticulosis.     Review of Systems Review of Systems Constitutional: -fever, +chills, -sweats, -unexpected weight change,+fatigue Cardiology:  -chest pain, -palpitations, -edema Respiratory: -cough, -shortness of breath, -wheezing Gastroenterology: +abdominal pain, -nausea, -vomiting, -diarrhea, +constipation Hematology: -bleeding or bruising problems Musculoskeletal: -arthralgias, -myalgias, -joint swelling, +back pain Ophthalmology: -vision changes Urology: +dark urine -dysuria, -difficulty urinating, -hematuria, -urinary frequency, -urgency Neurology: -headache, -weakness, -tingling, -numbness   Urinalysis dipstick positive for bilirubin, protein and ketones Hemoccult negative Hgb finger stick 7.3    Objective:   Physical Exam  Constitutional: She appears well-developed and well-nourished. She has a sickly appearance.  Eyes: Conjunctivae and EOM are normal.  Cardiovascular: Normal rate, regular rhythm, S1 normal, S2 normal, normal heart sounds and normal  pulses.  Exam reveals no gallop.   No murmur heard. Pulmonary/Chest: Effort normal and breath sounds normal.  Abdominal: Soft. Bowel sounds are normal. She exhibits no distension. There is no hepatosplenomegaly. There is tenderness in the suprapubic area. There is no rebound, no guarding, no CVA tenderness, no tenderness at McBurney's point and negative Murphy's sign.  Neurological: She is alert.  Skin: Skin is warm, dry and intact.    BP 112/70 mmHg  Pulse 99  Temp(Src) 98.7 F (37.1 C) (Tympanic)  Ht 5\' 5"  (1.651 m)  Wt 149 lb (67.586 kg)  BMI 24.79 kg/m2  SpO2 99%  LMP 01/18/2015   Dr. Tomi Bamberger examined this patient in conjunction with this provider.     Assessment & Plan:  Anemia, unspecified anemia type - Plan: CBC with Differential/Platelet, Comprehensive metabolic panel, Iron, Ferritin, POCT occult blood stool  Diverticulosis of colon without hemorrhage  Constipation, unspecified constipation type  Other fatigue - Plan: TSH  FH: hyperlipidemia - Plan: Lipid panel  Lower abdominal pain - Plan: POCT Urinalysis Dipstick  Suspect that her pain is related to constipation due to the location of her pain, her symptoms and the fact that she has not had a bowel movement since Monday. Recommend that she take a laxative and try to get her bowels moving and see if this makes a difference with her pain. Will see what her lab results are in the morning and if she had any results from the laxative and determine whether imaging is indicated. I think her Hgb finger stick of 7.3 is not an acute drop but more of a slow process since her last Hgb was in the 8 range and her vitals today are within normal limits. Does not appear to have a  source of acute blood loss. Her heavy menses about a week ago most likely played a role as well as the fact that she is not taking her iron on a regular basis.

## 2015-02-01 NOTE — ED Notes (Signed)
Pt was sent by primary Dr to rule our appendicitis, was seen today and WBC were elevated

## 2015-02-02 ENCOUNTER — Encounter (HOSPITAL_COMMUNITY): Payer: Self-pay

## 2015-02-02 ENCOUNTER — Emergency Department (HOSPITAL_COMMUNITY): Payer: 59

## 2015-02-02 MED ORDER — ONDANSETRON HCL 4 MG/2ML IJ SOLN
4.0000 mg | Freq: Once | INTRAMUSCULAR | Status: AC
  Administered 2015-02-02: 4 mg via INTRAVENOUS
  Filled 2015-02-02: qty 2

## 2015-02-02 MED ORDER — IOHEXOL 300 MG/ML  SOLN
25.0000 mL | Freq: Once | INTRAMUSCULAR | Status: AC | PRN
  Administered 2015-02-02: 25 mL via ORAL

## 2015-02-02 MED ORDER — MORPHINE SULFATE 4 MG/ML IJ SOLN
4.0000 mg | Freq: Once | INTRAMUSCULAR | Status: AC
  Administered 2015-02-02: 4 mg via INTRAVENOUS
  Filled 2015-02-02: qty 1

## 2015-02-02 MED ORDER — IOHEXOL 300 MG/ML  SOLN
100.0000 mL | Freq: Once | INTRAMUSCULAR | Status: AC | PRN
  Administered 2015-02-02: 100 mL via INTRAVENOUS

## 2015-02-02 NOTE — ED Provider Notes (Signed)
CSN: [id number]     Arrival date & time 02/01/15  2316 History   First MD Initiated Contact with Patient 02/01/15 2355     Chief Complaint  Patient presents with  . Abdominal Pain     (Consider location/radiation/quality/duration/timing/severity/associated sxs/prior Treatment) HPI Comments: Patient with lower abdominal pain x 1 day sent by PCP after in-office evaluation to evaluate for acute appendicitis. She has had suprapubic and RLQ abdominal pain since last night. She reports a slightly decreased appetite, denies nausea, vomiting, diarrhea, urinary symptoms or decreased urination. No known fever. Per patient, she was contacted by her doctor with report of elevated WBC count and need for emergent evaluation.   Patient is a 49 y.o. female presenting with abdominal pain. The history is provided by the patient. No language interpreter was used.  Abdominal Pain Pain location:  RLQ and suprapubic Pain quality: dull   Associated symptoms: no chills, no diarrhea, no dysuria, no fever, no nausea and no vomiting     Past Medical History  Diagnosis Date  . Endometrial polyp   . Iron deficiency anemia   . Wears contact lenses    Past Surgical History  Procedure Laterality Date  . Cesarean section   1988/  02-13-2003/   06-27-2004    LAST C/S  W/ BILATERAL TUBAL LIGATION  . Breast biopsy Right 08/13/2012    Procedure: BREAST BIOPSY WITH NEEDLE LOCALIZATION;  Surgeon: Edward Jolly, MD;  Location: [facility];  Service: General;  Laterality: Right;   lobular neoplasia  . Dilatation & currettage/hysteroscopy with resectocope N/A 08/15/2013    Procedure: DILATATION & CURETTAGE/HYSTEROSCOPY WITH RESECTOCOPE;  Surgeon: Darlyn Chamber, MD;  Location: [facility];  Service: Gynecology;  Laterality: N/A;   Family History  Problem Relation Age of Onset  . Hyperlipidemia Mother   . Hypertension Mother   . Arthritis Mother   . Colon cancer Mother 38  . Cancer  Mother     colon  . Hypertension Father   . Hyperlipidemia Father   . Stroke Brother   . Stroke Maternal Aunt 74  . Heart disease Maternal Aunt 72    CABG  . Stroke Maternal Grandmother   . Hypertension Brother   . Diabetes Maternal Aunt   . Arthritis Son     exercise-induced   Social History  Substance Use Topics  . Smoking status: Never Smoker   . Smokeless tobacco: Never Used  . Alcohol Use: No   OB History    Gravida Para Term Preterm AB TAB SAB Ectopic Multiple Living   3 3 3       3      Review of Systems  Constitutional: Negative for fever and chills.  HENT: Negative.   Respiratory: Negative.   Cardiovascular: Negative.   Gastrointestinal: Positive for abdominal pain. Negative for nausea, vomiting and diarrhea.  Genitourinary: Negative.  Negative for dysuria, frequency and decreased urine volume.  Musculoskeletal: Negative.  Negative for myalgias and back pain.  Skin: Negative.   Neurological: Negative.       Allergies  Review of patient's allergies indicates no known allergies.  Home Medications   Prior to Admission medications   Medication Sig Start Date End Date Taking? Authorizing Provider  ferrous fumarate (HEMOCYTE - 106 MG FE) 325 (106 FE) MG TABS tablet Take 1 tablet by mouth daily.   Yes Historical Provider, MD  Multiple Vitamins-Minerals (MULTIVITAMIN WITH MINERALS) tablet Take 1 tablet by mouth daily.     Yes  Historical Provider, MD  naproxen sodium (ANAPROX) 220 MG tablet Take 220 mg by mouth as needed (pain).    Yes Historical Provider, MD   BP 108/71 mmHg  Pulse 95  Temp(Src) 98.7 F (37.1 C) (Oral)  Resp 18  SpO2 99%  LMP 01/18/2015 Physical Exam  Constitutional: She is oriented to person, place, and time. She appears well-developed and well-nourished.  Neck: Normal range of motion.  Pulmonary/Chest: Effort normal.  Abdominal: Soft. She exhibits no distension and no mass. There is tenderness. There is no rebound and no guarding.   Tender to RLQ over McBirney's point. There is also mild LLQ and suprapubic abdominal tenderness. No peritoneal signs. No rebound or guarding. Non-distended abdomen.  Neurological: She is alert and oriented to person, place, and time.  Skin: Skin is warm and dry.  Psychiatric: She has a normal mood and affect.    ED Course  Procedures (including critical care time) Labs Review Labs Reviewed - No data to display  Imaging Review No results found. I, Fajr Fife A, personally reviewed and evaluated these images and lab results as part of my medical decision-making.   EKG Interpretation None     Ct Abdomen Pelvis W Contrast  02/02/2015   CLINICAL DATA:  Acute onset of right lower quadrant abdominal pain, chills and leukocytosis. Initial encounter.  EXAM: CT ABDOMEN AND PELVIS WITH CONTRAST  TECHNIQUE: Multidetector CT imaging of the abdomen and pelvis was performed using the standard protocol following bolus administration of intravenous contrast.  CONTRAST:  118mL OMNIPAQUE IOHEXOL 300 MG/ML  SOLN  COMPARISON:  None.  FINDINGS: Minimal left basilar atelectasis is noted.  Scattered small hypodensities within the liver are nonspecific, measuring up to 8 mm in size. The spleen is unremarkable in appearance. A single stone is noted within the gallbladder. The gallbladder is otherwise unremarkable. The pancreas and adrenal glands are unremarkable.  The kidneys are unremarkable in appearance. There is no evidence of hydronephrosis. No renal or ureteral stones are seen. No perinephric stranding is appreciated.  No free fluid is identified. The small bowel is unremarkable in appearance. The stomach is within normal limits. No acute vascular abnormalities are seen.  The appendix is normal in caliber, without evidence of appendicitis. The colon is unremarkable in appearance.  The bladder is mildly distended, displaced anteriorly by the uterus.  There is a large mildly heterogeneous mass filling the  endometrial canal, measuring 8.6 x 6.9 x 7.2 cm. This is an unusual appearance for a endometrial polyp. Malignancy cannot be excluded. There is vague heterogeneity with regard to the myometrium. The the ovaries are grossly symmetric. No suspicious adnexal masses are seen. A tampon is noted at the vagina. No inguinal lymphadenopathy is seen.  No acute osseous abnormalities are identified.  IMPRESSION: 1. Large mildly heterogeneous mass filling the endometrial canal, measuring 8.6 x 6.9 x 7.2 cm. This would be unusual for an endometrial polyp. Malignancy cannot be excluded. Further evaluation recommended, as deemed clinically appropriate. 2. Small nonspecific hypodensities within the liver measure up to 8 mm in size. 3. Cholelithiasis; gallbladder otherwise unremarkable.   Electronically Signed   By: Garald Balding M.D.   On: 02/02/2015 02:43    MDM   Final diagnoses:  None    1. Pelvic mass  Labs from primary care visit reviewed. Leukocytosis of 20.1, left shift (95%). UA negative for infection.   CT scan performed to r/o appy. Findings include a large mass in endometrial canal, malignancy not excluded. Discussed results  with the patient. Incidentally, a tampon is noted in the vagina and patient is aware - not a retained FB. She denies vaginal discharge or unusual bleeding. A pelvic exam was offered but she states she will wait for GYN which has been recommended for today or early next week.   Pain controlled. Hemoglobin is baseline for the patient. Strongly encouraged GYN follow up for further evaluation of abnormal CT and contact with primary care regarding unexplained leukocytosis.    Charlann Lange, PA-C 02/02/15 [id number]  Everlene Balls, MD 02/02/15 [phone]

## 2015-02-02 NOTE — Discharge Instructions (Signed)
Pelvic Mass °A "mass" is a lump that either your caregiver found during an examination or you found before seeing your caregiver. The "pelvis" is the lower portion of the trunk in between the hip bones. There are many possible reasons why a lump has appeared. Testing will help determine the cause and the steps to a solution. °CAUSES  °Before complete testing is done, it may be difficult or impossible for your caregiver to know if the lump is truly in one of the pelvic organs (such as the uterus or ovaries) or is coming from one of many organs that are near the pelvis. Problems in the colon or kidney can also lead to a lump that might seem to be in the pelvis. °If testing shows that the mass is in the pelvis, there are still many possible causes: °· Tumors and cancers. These problems are relatively common and are the greatest source of worry for patients. Cancerous lumps in the pelvis may be due to cancers that started in the uterus or ovaries or due to cancers that started in other areas and then spread to the pelvis. Many cancers are very treatable when found early. °· Non-cancerous tumors and masses. There are a large number of common and uncommon non-cancerous problems that can lead to a mass in the pelvis. Two very common ones are fibroids of the uterus and ovarian cysts. Before testing and/or surgery, it may be impossible to tell the difference between these problems and a cancer. °· Infection. Certain types of infections can produce a mass in the pelvis. The infection might be caused by bacteria. If there is an infection treatment might include antibiotics. Masses from infection can also be caused by certain viruses, and in rare cases, by fungi or parasites. If infection is the cause, your caregiver will be able to determine the type of germ responsible for the mass by doing appropriate testing. °· Inflammatory bowel disease. These are diseases thought to be caused by a defect in the immune system of the  intestine. There are two inflammatory bowel diseases: Ulcerative Colitis and Crohn's Disease. They are lifelong problems with symptoms that can come and go. Sometimes, patients with these diseases will develop a mass in the lower part of the colon that can make it seem as though there is a mass in the pelvis. °· Past Surgery. If there has been pelvic surgery in the past, and there is a lot of scarring that forms during the process of healing, this can eventually fell like a mass when examined by your caregiver. As with the other problems described above, this may or may not be associated with symptoms or feeling badly. °· Ectopic Pregnancy. This is a condition where the growing fetus is growing outside of the uterus. This is a common cause for a pelvic mass and may become a serious or life-threatening problem that requires immediate surgery. °SYMPTOMS  °In people with a pelvic mass, there may be a large variety of associated symptoms including:  °· No symptoms, other than the appearance of the mass itself. °· Cramping, nausea, diarrhea. °· Fever, vomiting, weakness. °· Pelvic, side, and/or back pain. °· Weight loss. °· Constipation. °· Problems with vaginal bleeding. This can be very variable. Bleeding might be very light or very heavy. Bleeding may be mixed with large clots. Menstruation may be very frequent and may seem to almost completely stop. There may be varying levels of pain with menstruation. °· Urinary symptoms including frequent urination, inability to empty the   bladder completely, or urinating very small amounts. °DIAGNOSIS  °Because of the large number of causes of a mass in the pelvis, your caregiver will ask you to undergo testing in order to get a clear diagnosis in a timely manner. The tests might include some or all of the following: °· Blood tests such as a blood count, measurement of common minerals in the blood, kidney/liver/pancreas function, pregnancy test, and others. °· X-rays. Plain x-rays  and special x-rays may be requested except if you are pregnant. °· Ultrasound. This is a test that uses sound waves to "paint a picture" of the mass. The type of "sound picture" that is seen can help to narrow the diagnosis. °· CAT scan and MRI imaging. Each can provide additional information as to the different characteristics of the mass and can help to develop a final plan for diagnostics and treatment. If cancer is suspected, these special tests can also help to show any spread of the cancer to other parts of the body. It is possible that these tests may not be ordered if you are pregnant. °· Laparoscopy. This is a special exam of the inside of the pelvic area using a slim, flexible, lighted tube. This allows your caregiver to get a direct look at the mass. Sometimes, this allows getting a very small piece of the mass (a biopsy). This piece of tissue can then be examined in a lab that will frequently lead to a clear diagnosis. In some cases, your caregiver can use a laparoscope to completely remove the mass after it has been examined. °· Surgery. Sometimes, a diagnosis can only be made by carrying out an operation and obtaining a biopsy (as noted above). Many times, the biopsy is obtained and the mass is removed during the same operation. °These are the most common ways for determining the exact cause of the mass. Your caregiver may recommend other tests that are not listed here. °TREATMENT  °Treatment(s) can only be recommended after a diagnosis is made. Your caregiver will discuss your test results with you, the meanings of the tests, and the recommended steps to begin treatment. He/she will also recommend whether you need to be examined by specialists as you go through the steps of diagnosis and a treatment plan is developed. °HOME CARE INSTRUCTIONS  °· Test preparation. Carefully follow instructions when preparing for certain tests. This may involve many things such as: °¨ Drinking fluids to fill the bladder  before a pelvic ultrasound. °¨ Fasting before certain blood tests. °¨ Drinking special "contrast" fluids that are necessary for obtaining the best CAT scan and MRI images. °· Medications. Your caregiver may prescribe medications to help relieve symptoms while you undergo testing. It is important that your current medications (prescription, non-prescription, herbal, vitamins, etc.) be kept in mind when new prescriptions are recommended. °· Diet. There may be a need for changes in diet in order to help with symptom relief while testing is being done. If this applies to you, your caregiver will discuss these changes with you. °SEEK MEDICAL CARE IF:  °· You cannot hold down any of the recommended fluids used to prepare for tests such as CAT scan MRI. °· You feel that you are having trouble with any new prescriptions. °· You develop new symptoms of pain, vomiting, diarrhea, fever, or other problems that you did not feel since your last exam. °· You experience inability to empty your bladder completely or develop painful and/or bloody urination. °SEEK IMMEDIATE MEDICAL CARE IF:  °·   You vomit bright red blood, or a coffee ground appearing material. °· You have blood in your stools, or the stools turn black and tarry. °· You have an abnormal or increased amount of vaginal bleeding. °· You have a fever. °· You develop easy bruising or bleeding. °· You develop pain that is not controlled by your medication. °· You feel worsening weakness or you have a fainting episode. °· You feel that the mass has suddenly gotten larger. °· You develop severe bloating in the abdomen and/or pelvis. °· You cannot pass any urine. °MAKE SURE YOU:  °· Understand these instructions. °· Will watch your condition. °· Will get help right away if you are not doing well or get worse. °Document Released: 09/16/2006 Document Revised: 09/01/2011 Document Reviewed: 05/25/2007 °[employer]® Patient Information ©2015 [employer]. This information is not  intended to replace advice given to you by your health care provider. Make sure you discuss any questions you have with your health care provider. ° °

## 2015-02-02 NOTE — Telephone Encounter (Signed)
Thank you :)

## 2015-02-09 ENCOUNTER — Telehealth: Payer: Self-pay | Admitting: Internal Medicine

## 2015-02-09 NOTE — Telephone Encounter (Signed)
Left message for pt to call me back 

## 2015-02-09 NOTE — Telephone Encounter (Signed)
-----   Message from Girtha Rm, NP sent at 02/09/2015  4:55 PM EDT ----- Will you please call and check on patient [name] and ask her if she has seen gynecology yet or had a follow up scan of any type. How is she feeling? Thanks

## 2015-02-12 ENCOUNTER — Telehealth: Payer: Self-pay | Admitting: *Deleted

## 2015-02-12 NOTE — Telephone Encounter (Signed)
Pt called back and states that she does not see Dr. Arvella Nigh until Sept 7th @ [facility]. She is not having any more pain and when she does it nothing like when she was here with pain. She does want some info about her white count and why its high. Please advise pt @ [phone]

## 2015-02-12 NOTE — Telephone Encounter (Signed)
I was hoping for her to have sooner appointment, but I guess if her pain has resolved, it is okay to wait.  We can just have her come for repeat CBC (lab visit) if she really isn't having any ongoing problems at this point. If WBC remains high, I would think that it would be related to her pain/GYN issue, and we could see if we could get her in sooner with GYN.  Ensure that she isn't having any fevers, or other signs of infection (urinary complaints, diarrhea, sore throat,sinus problems, etc).  If she does, schedule visit with me.

## 2015-02-12 NOTE — Telephone Encounter (Signed)
[name] saw patient (this is the one with the pelvic mass), she is seeing Dr.Mccomb (GYN) on 02/28/15. Patient called today and stated that she wanted someone to address her elevated white count found in CBC. [name] wanted me to ask you if you wanted her to come in and see you for follow up since she is your patient, or should she come in and see her? Or maybe just a phone call to address the elevated white count. Please advise, thanks.

## 2015-02-12 NOTE — Telephone Encounter (Signed)
Spoke with patient and she states that she really does feel just fine. No symptoms at all of any type. She did not want me to call to try to get in earlier at Dr.McComb's office, she said that he is on vacation and that was the earliest he could possibly see her-she has seen him for 20 years and wants to wait, especially since she is feeling fine. Does not feel the need to come in for recheck on CBC, she will just wait to see Dr.McComb. I did tell her that if she should develop any fevers, signs of infection, diarrhea, ST, sinus problems, etc to please call us and schedule appt with Dr.Knapp if before appt with GYN. Patient vebalized understanding.

## 2015-04-25 NOTE — H&P (Signed)
  Patient name [name], [name] DICTATION# [id number] CSN# [id number]  Darlyn Chamber, MD 04/25/2015 7:46 AM

## 2015-04-26 NOTE — H&P (Unsigned)
[name], [name]             ACCOUNT NO.:  [id number]  MEDICAL RECORD NO.:  [id number]  LOCATION:                                FACILITY:  [facility]  PHYSICIAN:  Darlyn Chamber, M.D.   DATE OF BIRTH:  Feb 03, 1966  DATE OF ADMISSION:  05/09/2015 DATE OF DISCHARGE:                             HISTORY & PHYSICAL   DATE OF SURGERY:  May 09, 2015 at [facility] here in [place].  HISTORY OF PRESENT ILLNESS:  The patient is a 49 year old, gravida 3, para 3 female, comes in for hysteroscopy with MyoSure resection along with hydrothermal ablation.  The patient has a history of known uterine fibroids.  Her cycles are regular.  She has two days of heavy flow, changing pads and tampons every 3 hours with clots.  She has some dysmenorrhea, but it was managed with over-the-counter measurements. She has an associated anemia with this, which was 11.2.  She has had several ultrasounds that did reveal intramural fibroids.  She underwent a hysteroscopy in 2015 with resection of benign endometrial polyps and curettings were negative.  She subsequently had a CT scan done this year that bring the question of an endometrial canal process measuring 8.4 x 6.9 x 7.2.  We did a saline infusion ultrasound revealed a fibroid left of midline measuring 5.5 x 3.3 x 2.5 and other measuring 2.1.  There was a hypoechoic density with cystic areas of the endometrial cavity.  We felt this probably represents a uterine fibroid.  She is going to come in again for the above-noted surgery, hysteroscopy with D and C, MyoSure resection and possible hydrothermal ablation.  ALLERGIES:  In terms of allergies, the patient has no known drug allergies.  MEDICATIONS:  Include iron pills.  PAST MEDICAL HISTORY:  Usual childhood diseases.  No significant sequelae.  PAST SURGICAL HISTORY:  She has had three cesarean sections.  Did have a bilateral tubal ligation with the last one and had the previous hysteroscopy  as noted above.  FAMILY HISTORY:  Noncontributory.  SOCIAL HISTORY:  Reveals no tobacco or alcohol use.  REVIEW OF SYSTEMS:  Noncontributory.  PHYSICAL EXAMINATION:  VITAL SIGNS:  The patient is afebrile with stable vital signs. HEENT:  The patient is normocephalic.  Pupils are equal, round, and reactive to light and accommodation.  Extraocular movements are intact. Sclerae and conjunctivae are clear.  Oropharynx clear. NECK:  Without thyromegaly. BREASTS:  Not examined. LUNGS:  Clear. CARDIOVASCULAR SYSTEM:  Regular rhythm and rate without murmurs or gallops. ABDOMEN:  Benign.  No mass, organomegaly, or tenderness. PELVIC:  Normal external genitalia.  Vaginal mucosa is clear.  Cervix unremarkable.  Uterus is 9 weeks in size consistent with known uterine fibroids.  Adnexa unremarkable. EXTREMITIES:  Trace edema. NEUROLOGIC:  Grossly within normal limits.  IMPRESSION: 1. Known uterine fibroids with associated menorrhagia. 2. Endometrial process, probably submucosal fibroid. 3. Anemia.  PLAN:  The patient to undergo hysteroscopy with the MyoSure and D and C, and then results with may proceed with hydrothermal ablation for management of her menorrhagia.  The nature of the procedure had been discussed.  Success rates of 85% are quoted.  The risk of surgery included the  risk of infection.  Risk of hemorrhage that could require transfusion with the risk of AIDS or hepatitis.  Excessive bleeding could require hysterectomy.  There is a risk of perforation with injury to adjacent organs, this could include bladder, bowel or ureters that could require further exploratory surgery.  Risk of deep venous thrombosis and pulmonary embolus.  The patient expressed understanding of indications and risks.     Darlyn Chamber, M.D.     JSM/MEDQ  D:  04/25/2015  T:  04/26/2015  Job:  [id number]

## 2015-05-02 ENCOUNTER — Ambulatory Visit (INDEPENDENT_AMBULATORY_CARE_PROVIDER_SITE_OTHER): Payer: 59 | Admitting: Family Medicine

## 2015-05-02 ENCOUNTER — Encounter: Payer: Self-pay | Admitting: Family Medicine

## 2015-05-02 VITALS — BP 110/62 | HR 76 | Ht 64.75 in | Wt 144.8 lb

## 2015-05-02 DIAGNOSIS — D509 Iron deficiency anemia, unspecified: Secondary | ICD-10-CM

## 2015-05-02 DIAGNOSIS — R3915 Urgency of urination: Secondary | ICD-10-CM | POA: Diagnosis not present

## 2015-05-02 DIAGNOSIS — Z131 Encounter for screening for diabetes mellitus: Secondary | ICD-10-CM | POA: Diagnosis not present

## 2015-05-02 DIAGNOSIS — N3000 Acute cystitis without hematuria: Secondary | ICD-10-CM | POA: Diagnosis not present

## 2015-05-02 DIAGNOSIS — Z0000 Encounter for general adult medical examination without abnormal findings: Secondary | ICD-10-CM | POA: Diagnosis not present

## 2015-05-02 LAB — CBC WITH DIFFERENTIAL/PLATELET
BASOS ABS: 0 10*3/uL (ref 0.0–0.1)
BASOS PCT: 1 % (ref 0–1)
Eosinophils Absolute: 0 10*3/uL (ref 0.0–0.7)
Eosinophils Relative: 1 % (ref 0–5)
HEMATOCRIT: 30 % — AB (ref 36.0–46.0)
HEMOGLOBIN: 9.5 g/dL — AB (ref 12.0–15.0)
LYMPHS PCT: 34 % (ref 12–46)
Lymphs Abs: 1.4 10*3/uL (ref 0.7–4.0)
MCH: 25.7 pg — ABNORMAL LOW (ref 26.0–34.0)
MCHC: 31.7 g/dL (ref 30.0–36.0)
MCV: 81.1 fL (ref 78.0–100.0)
MPV: 11.5 fL (ref 8.6–12.4)
Monocytes Absolute: 0.4 10*3/uL (ref 0.1–1.0)
Monocytes Relative: 9 % (ref 3–12)
NEUTROS ABS: 2.2 10*3/uL (ref 1.7–7.7)
Neutrophils Relative %: 55 % (ref 43–77)
Platelets: 296 10*3/uL (ref 150–400)
RBC: 3.7 MIL/uL — AB (ref 3.87–5.11)
RDW: 14.8 % (ref 11.5–15.5)
WBC: 4 10*3/uL (ref 4.0–10.5)

## 2015-05-02 LAB — POCT URINALYSIS DIPSTICK
Bilirubin, UA: NEGATIVE
GLUCOSE UA: NEGATIVE
PH UA: 5.5
Spec Grav, UA: >=1.03
Urobilinogen, UA: NEGATIVE

## 2015-05-02 LAB — FERRITIN: FERRITIN: 3 ng/mL — AB (ref 10–291)

## 2015-05-02 MED ORDER — CIPROFLOXACIN HCL 250 MG PO TABS
250.0000 mg | ORAL_TABLET | Freq: Two times a day (BID) | ORAL | Status: DC
Start: 2015-05-02 — End: 2016-02-04

## 2015-05-02 NOTE — Patient Instructions (Signed)
  HEALTH MAINTENANCE RECOMMENDATIONS:  It is recommended that you get at least 30 minutes of aerobic exercise at least 5 days/week (for weight loss, you may need as much as 60-90 minutes). This can be any activity that gets your heart rate up. This can be divided in 10-15 minute intervals if needed, but try and build up your endurance at least once a week.  Weight bearing exercise is also recommended twice weekly.  Eat a healthy diet with lots of vegetables, fruits and fiber.  "Colorful" foods have a lot of vitamins (ie green vegetables, tomatoes, red peppers, etc).  Limit sweet tea, regular sodas and alcoholic beverages, all of which has a lot of calories and sugar.  Up to 1 alcoholic drink daily may be beneficial for women (unless trying to lose weight, watch sugars).  Drink a lot of water.  Calcium recommendations are 1200-1500 mg daily (1500 mg for postmenopausal women or women without ovaries), and vitamin D 1000 IU daily.  This should be obtained from diet and/or supplements (vitamins), and calcium should not be taken all at once, but in divided doses.  Monthly self breast exams and yearly mammograms for women over the age of 1 is recommended.  Sunscreen of at least SPF 30 should be used on all sun-exposed parts of the skin when outside between the hours of 10 am and 4 pm (not just when at beach or pool, but even with exercise, golf, tennis, and yard work!)  Use a sunscreen that says "broad spectrum" so it covers both UVA and UVB rays, and make sure to reapply every 1-2 hours.  Remember to change the batteries in your smoke detectors when changing your clock times in the spring and fall.  Use your seat belt every time you are in a car, and please drive safely and not be distracted with cell phones and texting while driving.   Try and take your iron daily--if not with a meal, then with orange juice. Take the antibiotic for bladder infection--we will contact you with culture results when  available.  Consider trying Lactaid tablets prior to eating dairy, vs trying dairy-free items such as Soy milk or almond milk

## 2015-05-02 NOTE — Progress Notes (Signed)
Chief Complaint  Patient presents with  . Annual Exam    fasting annual exam, no pap-sees Dr.McComb and is UTD. Having ablation 05/09/15. Has had urinary urgency x 2 weeks.    [name] is a 49 y.o. female who presents for a complete physical.  She has the following concerns:  She has been getting up 2-3 times/night to void (used to only get up just once), as well as having urinary urgency with some occasional leakage over the last 2 weeks.  Denies dysuria, hematuria, flank pain.  She also complains of feeling gassy after eating cheese, milk, and other dairy products. She has tried Beano and Gas-X, but wondering what could be more effective.  She is scheduled for D&C/hysteroscopy with hydrothermal ablation on 11/16 with Dr. Radene Knee. She had seen Vickie in mid-August with abdominal/pelvic pain, along with some constipation and fingerstick Hgb of 7.3 (higher on full CBC, see labs below). CT evaluation revealed: IMPRESSION: 1. Large mildly heterogeneous mass filling the endometrial canal, measuring 8.6 x 6.9 x 7.2 cm. This would be unusual for an endometrial polyp. Malignancy cannot be excluded. Further evaluation recommended, as deemed clinically appropriate. 2. Small nonspecific hypodensities within the liver measure up to 8 mm in size. 3. Cholelithiasis; gallbladder otherwise unremarkable.  Iron deficiency anemia--she admits to taking iron only about 4 days/week, just once daily.  Mainly because she is inconsistent with her eating, and was told to take it with food.  Periods remain heavy, and lasting longer than in the past.  Last period lasted 5-6 days, and the pain lasted longer (lasted beyond her cycle, for a total of 2 weeks). She saw Dr. Radene Knee in September (and again yesterday).  No labs were done at either visit (last labs were those done 8/11).  Immunization History  Administered Date(s) Administered  . Influenza Whole 03/23/2010  . Influenza,inj,Quad PF,36+ Mos 02/14/2013   . Influenza-Unspecified 04/13/2015  . Tdap 02/10/2011   Last Pap smear: Dr. Radene Knee, UTD Last mammogram: 07/2014 Last colonoscopy: 06/2012 Dr. Sharlett Iles Last DEXA: 2011 Dentist: yearly Ophtho: yearly Exercise: 3x/week, walk/run and weights Vitamin D level was 40 in 2012 (also on daily MVI then)  Past Medical History  Diagnosis Date  . Endometrial polyp   . Iron deficiency anemia   . Wears contact lenses     Past Surgical History  Procedure Laterality Date  . Cesarean section   1988/  02-13-2003/   06-27-2004    LAST C/S  W/ BILATERAL TUBAL LIGATION  . Breast biopsy Right 08/13/2012    Procedure: BREAST BIOPSY WITH NEEDLE LOCALIZATION;  Surgeon: Edward Jolly, MD;  Location: [facility];  Service: General;  Laterality: Right;   lobular neoplasia  . Dilatation & currettage/hysteroscopy with resectocope N/A 08/15/2013    Procedure: DILATATION & CURETTAGE/HYSTEROSCOPY WITH RESECTOCOPE;  Surgeon: Darlyn Chamber, MD;  Location: [facility];  Service: Gynecology;  Laterality: N/A;    Social History   Social History  . Marital Status: Married    Spouse Name: N/A  . Number of Children: 3  . Years of Education: N/A   Occupational History  . hair dresser    Social History Main Topics  . Smoking status: Never Smoker   . Smokeless tobacco: Never Used  . Alcohol Use: No  . Drug Use: No  . Sexual Activity:    Partners: Male    Birth Control/ Protection: Surgical   Other Topics Concern  . Not on file   Social  History Narrative   Lives with husband, 2 sons (oldest son is grown, is in prison). Hairdresser    Family History  Problem Relation Age of Onset  . Hyperlipidemia Mother   . Hypertension Mother   . Arthritis Mother   . Colon cancer Mother 31  . Cancer Mother     colon  . Hypertension Father   . Hyperlipidemia Father   . Stroke Brother   . Stroke Maternal Aunt 74  . Heart disease Maternal Aunt 72    CABG  . Stroke Maternal  Grandmother   . Hypertension Brother   . Asthma Son     exercise-induced  . Diabetes Neg Hx     Outpatient Encounter Prescriptions as of 05/02/2015  Medication Sig Note  . ferrous fumarate (HEMOCYTE - 106 MG FE) 325 (106 FE) MG TABS tablet Take 1 tablet by mouth daily. 05/02/2015: Remembers to take it about 4x/wk  . loratadine (CLARITIN) 10 MG tablet Take 10 mg by mouth daily.   . Multiple Vitamins-Minerals (MULTIVITAMIN WITH MINERALS) tablet Take 1 tablet by mouth daily.     Marland Kitchen OVER THE COUNTER MEDICATION Take 2 tablets by mouth daily. Hydroxycut 05/02/2015: Uses occasionally  . ciprofloxacin (CIPRO) 250 MG tablet Take 1 tablet (250 mg total) by mouth 2 (two) times daily.   Marland Kitchen ibuprofen (ADVIL,MOTRIN) 800 MG tablet Take 800 mg by mouth every 8 (eight) hours as needed for moderate pain.   . naproxen sodium (ANAPROX) 220 MG tablet Take 440 mg by mouth 2 (two) times daily as needed (pain).     No facility-administered encounter medications on file as of 05/02/2015.    No Known Allergies  ROS: The patient denies anorexia, fever, weight changes, headaches, vision changes, decreased hearing, ear pain, sore throat, chest pain, palpitations, dizziness, syncope, dyspnea on exertion, cough, swelling, nausea, vomiting, diarrhea, constipation, abdominal pain, melena, hematochezia, indigestion/heartburn, hematuria, dysuria, irregular menstrual cycles (+heavy periods and cramping as per HPI), vaginal discharge, odor or itch, genital lesions, joint pains, numbness, tingling, weakness, tremor, suspicious skin lesions, depression, anxiety, abnormal bleeding/bruising, or enlarged lymph nodes. She has known inguinal hernia, that remains easily reducible, no pain/discomfort.  +fatigue Seasonal allergies--controlled with claritin.  PHYSICAL EXAM:  BP 110/62 mmHg  Pulse 76  Ht 5' 4.75" (1.645 m)  Wt 144 lb 12.8 oz (65.681 kg)  BMI 24.27 kg/m2  LMP 04/04/2015  General Appearance:  Alert, cooperative, no  distress, appears stated age   Head:  Normocephalic, without obvious abnormality, atraumatic   Eyes:  PERRL, conjunctiva/corneas clear, EOM's intact, fundi  benign   Ears:  Normal TM's and external ear canals   Nose:  Nares normal, mucosa mildly edematous, no erythema, no drainage or sinus tenderness   Throat:  Lips, mucosa, and tongue normal; teeth and gums normal   Neck:  Supple, no lymphadenopathy; thyroid: no enlargement/tenderness/nodules; no carotid  bruit or JVD   Back:  Spine nontender, no curvature, ROM normal, no CVA tenderness   Lungs:  Clear to auscultation bilaterally without wheezes, rales or ronchi; respirations unlabored   Chest Wall:  No tenderness or deformity   Heart:  Regular rate and rhythm, S1 and S2 normal, no murmur, rub  or gallop   Breast Exam:  Deferred to GYN   Abdomen:  Soft, non-tender, nondistended, normoactive bowel sounds,  no masses, no hepatosplenomegaly. Small, bilateral inguinal hernias--easily reducible   Genitalia:  Deferred to GYN      Extremities:  No clubbing, cyanosis or edema   Pulses:  2+ and symmetric all extremities   Skin:  Skin color, texture, turgor normal, no rashes or lesions   Lymph nodes:  Cervical, supraclavicular, and axillary nodes normal   Neurologic:  CNII-XII intact, normal strength, sensation and gait; reflexes 2+ and symmetric throughout    Psych:  Normal mood, affect, hygiene and grooming       Urine dip: 2+ leuks, 1+ nitrite, trace protein, 1+ blood, trace ketones (fasting today); SG >1.030  Lab Results  Component Value Date   WBC 20.1* 02/01/2015   HGB 9.2* 02/01/2015   HCT 28.5* 02/01/2015   MCV 81.7 02/01/2015   PLT 261 02/01/2015   Lab Results  Component Value Date   IRON <10* 02/01/2015   FERRITIN 16 02/01/2015      Chemistry      Component Value Date/Time   NA 138 02/01/2015 0001   K 3.8 02/01/2015 0001   CL 105 02/01/2015 0001    CO2 24 02/01/2015 0001   BUN 17 02/01/2015 0001   CREATININE 0.81 02/01/2015 0001      Component Value Date/Time   CALCIUM 9.2 02/01/2015 0001   ALKPHOS 36 02/01/2015 0001   AST 14 02/01/2015 0001   ALT 8 02/01/2015 0001   BILITOT 0.7 02/01/2015 0001    Glucose 106  Lab Results  Component Value Date   CHOL 134 02/01/2015   HDL 88 02/01/2015   LDLCALC 41 02/01/2015   TRIG 26 02/01/2015   CHOLHDL 1.5 02/01/2015   Lab Results  Component Value Date   TSH 0.918 02/01/2015   ASSESSMENT/PLAN:  Annual physical exam - Plan: POCT Urinalysis Dipstick, Visual acuity screening, Glucose, random  Screening for diabetes mellitus - Plan: Glucose, random  Anemia, iron deficiency - recheck; try and take iron daily (with OJ if can't take with food) - Plan: CBC with Differential/Platelet, Ferritin, Iron  Acute cystitis without hematuria - Plan: Urine culture, ciprofloxacin (CIPRO) 250 MG tablet  Urinary urgency - Plan: Urine culture   CBC, iron, ferritin, fasting glucose.  UTI--treat presumptively cipro 250mg  BID x 5 days. Send urine culture and stop or change ABX based on results.  Lactose intolerance--discussed dairy-free alternatives vs Lactaid prior to dairy.  Discussed monthly self breast exams and yearly mammograms; at least 30 minutes of aerobic activity at least 5 days/week, weight-bearing exercise 2x/wk; proper sunscreen use reviewed; healthy diet, including goals of calcium and vitamin D intake and alcohol recommendations (less than or equal to 1 drink/day) reviewed; regular seatbelt use; changing batteries in smoke detectors. Immunization recommendations discussed, UTD. Colonoscopy UTD.  F/u 1 year, sooner prn

## 2015-05-03 LAB — GLUCOSE, RANDOM: GLUCOSE: 80 mg/dL (ref 65–99)

## 2015-05-03 LAB — IRON: IRON: 43 ug/dL (ref 40–190)

## 2015-05-04 LAB — URINE CULTURE: Colony Count: >=100000

## 2015-05-09 ENCOUNTER — Encounter (HOSPITAL_COMMUNITY): Payer: Self-pay | Admitting: Anesthesiology

## 2015-05-09 ENCOUNTER — Encounter (HOSPITAL_COMMUNITY)
Admission: RE | Disposition: A | Discharge status: Home / Self Care | Payer: Self-pay | Source: Ambulatory Visit | Attending: Obstetrics and Gynecology

## 2015-05-09 ENCOUNTER — Ambulatory Visit (HOSPITAL_COMMUNITY): Payer: 59 | Admitting: Anesthesiology

## 2015-05-09 ENCOUNTER — Ambulatory Visit (HOSPITAL_COMMUNITY)
Admission: RE | Admit: 2015-05-09 | Discharge: 2015-05-09 | Disposition: A | Discharge status: Home / Self Care | Payer: 59 | Source: Ambulatory Visit | Attending: Obstetrics and Gynecology | Admitting: Obstetrics and Gynecology

## 2015-05-09 DIAGNOSIS — D250 Submucous leiomyoma of uterus: Secondary | ICD-10-CM | POA: Insufficient documentation

## 2015-05-09 DIAGNOSIS — N920 Excessive and frequent menstruation with regular cycle: Secondary | ICD-10-CM | POA: Diagnosis present

## 2015-05-09 DIAGNOSIS — D649 Anemia, unspecified: Secondary | ICD-10-CM | POA: Diagnosis not present

## 2015-05-09 DIAGNOSIS — D259 Leiomyoma of uterus, unspecified: Secondary | ICD-10-CM

## 2015-05-09 HISTORY — PX: DILATATION & CURETTAGE/HYSTEROSCOPY WITH MYOSURE: SHX6511

## 2015-05-09 HISTORY — PX: DILITATION & CURRETTAGE/HYSTROSCOPY WITH HYDROTHERMAL ABLATION: SHX5570

## 2015-05-09 LAB — HCG, SERUM, QUALITATIVE: Preg, Serum: NEGATIVE

## 2015-05-09 SURGERY — DILATATION & CURETTAGE/HYSTEROSCOPY WITH HYDROTHERMAL ABLATION
Anesthesia: General | Site: Vagina

## 2015-05-09 MED ORDER — FENTANYL CITRATE (PF) 100 MCG/2ML IJ SOLN
INTRAMUSCULAR | Status: AC
  Filled 2015-05-09: qty 2

## 2015-05-09 MED ORDER — SODIUM CHLORIDE 0.9 % IR SOLN
Status: DC | PRN
  Administered 2015-05-09: 3000 mL

## 2015-05-09 MED ORDER — MIDAZOLAM HCL 2 MG/2ML IJ SOLN
INTRAMUSCULAR | Status: AC
  Filled 2015-05-09: qty 2

## 2015-05-09 MED ORDER — DEXAMETHASONE SODIUM PHOSPHATE 10 MG/ML IJ SOLN
INTRAMUSCULAR | Status: AC
  Filled 2015-05-09: qty 1

## 2015-05-09 MED ORDER — METOCLOPRAMIDE HCL 5 MG/ML IJ SOLN: 10.0000 mg | Freq: Once | INTRAMUSCULAR | Status: DC | PRN

## 2015-05-09 MED ORDER — SCOPOLAMINE 1 MG/3DAYS TD PT72
1.0000 | MEDICATED_PATCH | Freq: Once | TRANSDERMAL | Status: DC
  Administered 2015-05-09: 1.5 mg via TRANSDERMAL

## 2015-05-09 MED ORDER — OXYCODONE-ACETAMINOPHEN 7.5-325 MG PO TABS: 1.0000 | ORAL_TABLET | ORAL | Status: DC | PRN

## 2015-05-09 MED ORDER — MIDAZOLAM HCL 5 MG/5ML IJ SOLN
INTRAMUSCULAR | Status: DC | PRN
  Administered 2015-05-09: 2 mg via INTRAVENOUS

## 2015-05-09 MED ORDER — PROPOFOL 10 MG/ML IV BOLUS
INTRAVENOUS | Status: DC | PRN
  Administered 2015-05-09: 200 mg via INTRAVENOUS

## 2015-05-09 MED ORDER — LACTATED RINGERS IV SOLN
INTRAVENOUS | Status: DC
  Administered 2015-05-09 (×3): via INTRAVENOUS

## 2015-05-09 MED ORDER — MEPERIDINE HCL 25 MG/ML IJ SOLN: 6.2500 mg | INTRAMUSCULAR | Status: DC | PRN

## 2015-05-09 MED ORDER — HYDROCODONE-ACETAMINOPHEN 7.5-325 MG PO TABS: 1.0000 | ORAL_TABLET | Freq: Once | ORAL | Status: DC | PRN

## 2015-05-09 MED ORDER — LIDOCAINE HCL (CARDIAC) 20 MG/ML IV SOLN
INTRAVENOUS | Status: DC | PRN
  Administered 2015-05-09: 100 mg via INTRAVENOUS

## 2015-05-09 MED ORDER — ONDANSETRON HCL 4 MG/2ML IJ SOLN
INTRAMUSCULAR | Status: AC
  Filled 2015-05-09: qty 2

## 2015-05-09 MED ORDER — CEFAZOLIN SODIUM-DEXTROSE 2-3 GM-% IV SOLR
2.0000 g | INTRAVENOUS | Status: AC
  Administered 2015-05-09: 2 g via INTRAVENOUS

## 2015-05-09 MED ORDER — OXYCODONE-ACETAMINOPHEN 5-325 MG PO TABS
ORAL_TABLET | ORAL | Status: AC
  Filled 2015-05-09: qty 1

## 2015-05-09 MED ORDER — ONDANSETRON HCL 4 MG/2ML IJ SOLN
INTRAMUSCULAR | Status: DC | PRN
  Administered 2015-05-09: 4 mg via INTRAVENOUS

## 2015-05-09 MED ORDER — EPHEDRINE 5 MG/ML INJ
10.0000 mg | Freq: Once | INTRAVENOUS | Status: AC
  Administered 2015-05-09: 10 mg via INTRAVENOUS
  Filled 2015-05-09: qty 2

## 2015-05-09 MED ORDER — KETOROLAC TROMETHAMINE 30 MG/ML IJ SOLN
INTRAMUSCULAR | Status: AC
  Filled 2015-05-09: qty 1

## 2015-05-09 MED ORDER — LIDOCAINE HCL (CARDIAC) 20 MG/ML IV SOLN
INTRAVENOUS | Status: AC
  Filled 2015-05-09: qty 5

## 2015-05-09 MED ORDER — FENTANYL CITRATE (PF) 100 MCG/2ML IJ SOLN
25.0000 ug | INTRAMUSCULAR | Status: DC | PRN
  Administered 2015-05-09: 12.5 ug via INTRAVENOUS

## 2015-05-09 MED ORDER — DEXAMETHASONE SODIUM PHOSPHATE 4 MG/ML IJ SOLN
INTRAMUSCULAR | Status: DC | PRN
  Administered 2015-05-09: 10 mg via INTRAVENOUS

## 2015-05-09 MED ORDER — FENTANYL CITRATE (PF) 100 MCG/2ML IJ SOLN
INTRAMUSCULAR | Status: DC | PRN
  Administered 2015-05-09: 100 ug via INTRAVENOUS

## 2015-05-09 MED ORDER — KETOROLAC TROMETHAMINE 30 MG/ML IJ SOLN
INTRAMUSCULAR | Status: DC | PRN
  Administered 2015-05-09: 30 mg via INTRAVENOUS

## 2015-05-09 MED ORDER — PROPOFOL 10 MG/ML IV BOLUS
INTRAVENOUS | Status: AC
  Filled 2015-05-09: qty 20

## 2015-05-09 MED ORDER — LIDOCAINE HCL 1 % IJ SOLN
INTRAMUSCULAR | Status: DC | PRN
  Administered 2015-05-09: 20 mL

## 2015-05-09 MED ORDER — OXYCODONE-ACETAMINOPHEN 5-325 MG PO TABS
1.0000 | ORAL_TABLET | Freq: Once | ORAL | Status: AC
  Administered 2015-05-09: 1 via ORAL

## 2015-05-09 SURGICAL SUPPLY — 17 items
CATH ROBINSON RED A/P 16FR (CATHETERS) ×3 IMPLANT
CLOTH BEACON ORANGE TIMEOUT ST (SAFETY) ×3 IMPLANT
CONTAINER PREFILL 10% NBF 60ML (FORM) ×6 IMPLANT
DEVICE MYOSURE REACH (MISCELLANEOUS) ×3 IMPLANT
DRAPE LG THREE QUARTER DISP (DRAPES) ×3 IMPLANT
GLOVE BIO SURGEON STRL SZ7 (GLOVE) ×3 IMPLANT
GLOVE BIOGEL PI IND STRL 7.0 (GLOVE) ×2 IMPLANT
GLOVE BIOGEL PI INDICATOR 7.0 (GLOVE) ×1
GOWN STRL REUS W/TWL LRG LVL3 (GOWN DISPOSABLE) ×6 IMPLANT
PACK VAGINAL MINOR WOMEN LF (CUSTOM PROCEDURE TRAY) ×3 IMPLANT
PAD OB MATERNITY 4.3X12.25 (PERSONAL CARE ITEMS) ×3 IMPLANT
SEAL ROD LENS SCOPE MYOSURE (ABLATOR) ×3 IMPLANT
SET GENESYS HTA PROCERVA (MISCELLANEOUS) ×3 IMPLANT
SET TUBE HYSTEROSCOPIC INFLOW (TUBING) ×3 IMPLANT
SET TUBE OUTFLOW HYSTEROSCOPIC (TUBING) ×3 IMPLANT
SUT CHROMIC 0 CT 1 (SUTURE) ×3 IMPLANT
TOWEL OR 17X24 6PK STRL BLUE (TOWEL DISPOSABLE) ×6 IMPLANT

## 2015-05-09 NOTE — Transfer of Care (Signed)
Immediate Anesthesia Transfer of Care Note  Patient: [name]  Procedure(s) Performed: Procedure(s): DILATATION & CURETTAGE/HYSTEROSCOPY WITH HYDROTHERMAL ABLATION (N/A) MYOSURE  Patient Location: PACU  Anesthesia Type:General  Level of Consciousness: awake and sedated  Airway & Oxygen Therapy: Patient Spontanous Breathing and Patient connected to nasal cannula oxygen  Post-op Assessment: Report given to RN and Post -op Vital signs reviewed and stable  Post vital signs: Reviewed and stable  Last Vitals:  Filed Vitals:   05/09/15 0657  BP: 92/51  Pulse: 84  Temp: 36.8 C  Resp: 20    Complications: No apparent anesthesia complications

## 2015-05-09 NOTE — Discharge Instructions (Signed)

## 2015-05-09 NOTE — H&P (Signed)
  History and physical exam unchanged 

## 2015-05-09 NOTE — Anesthesia Procedure Notes (Signed)
Procedure Name: LMA Insertion Date/Time: 05/09/2015 8:33 AM Performed by: Riki Sheer Pre-anesthesia Checklist: Patient identified, Emergency Drugs available, Suction available, Patient being monitored and Timeout performed Patient Re-evaluated:Patient Re-evaluated prior to inductionOxygen Delivery Method: Circle system utilized Preoxygenation: Pre-oxygenation with 100% oxygen Intubation Type: IV induction Ventilation: Mask ventilation without difficulty LMA: LMA inserted LMA Size: 4.0 Number of attempts: 1 Placement Confirmation: CO2 detector,  positive ETCO2 and breath sounds checked- equal and bilateral Tube secured with: Tape Dental Injury: Teeth and Oropharynx as per pre-operative assessment

## 2015-05-09 NOTE — Op Note (Signed)
NAMERASHAD, [name]             ACCOUNT NO.:  [id number]  MEDICAL RECORD NO.:  [id number]  LOCATION:  [facility]                          FACILITY:  [facility]  PHYSICIAN:  Darlyn Chamber, M.D.   DATE OF BIRTH:  11-02-1965  DATE OF PROCEDURE:  05/09/2015 DATE OF DISCHARGE:                              OPERATIVE REPORT   PREOPERATIVE DIAGNOSIS:  Menorrhagia secondary to uterine fibroids with intrauterine process.  POSTOPERATIVE DIAGNOSIS:  Submucosal fibroid plus uterine enlargement with multiple fibroids.  OPERATIVE PROCEDURE:  Paracervical block.  Cervical dilation. Hysteroscopy with MyoSure biopsy of the submucosal fibroid along with endometrial curettings.  Subsequent hydrothermal ablation.  SURGEON:  Darlyn Chamber, M.D.  ANESTHESIA:  Paracervical block with general.  ESTIMATED BLOOD LOSS:  Minimal.  PACKS:  None.  DRAINS:  None.  INTRAOPERATIVE BLOOD PLACED:  None.  COMPLICATIONS:  None.  INDICATION:  Dictated in History and Physical.  PROCEDURE IN DETAIL:  The patient was taken to the OR and placed in a supine position.  She was subsequently placed in a dorsal supine position.  This was done using the [place].  After satisfactory level of general anesthesia was obtained, the perineum and vagina were prepped out with Betadine and draped as a sterile field.  She was examined.  Uterus was enlarged approximately 10 weeks in size with fibroids.  It was very retroverted.  We put in a speculum.  We had to manipulate the uterus; and finally, we secured the cervix with a single tooth tenaculum, then a Jacobs tenaculum.  We then removed the speculum and put in a weighted speculum.  We had trouble getting the cervix dilated.  We had used some small dilators.  Finally, we got the cervix dilated.  We continued dilating up to approximately a size 23 Pratt dilator.  The hysteroscope was introduced in the intrauterine cavity and was distended using saline.  Visualization showed a  markedly enlarged uterine cavity.  She had a submucosal fibroid.  We brought in the MyoSure, had really trouble due to blood, but we did obtain biopsies from this.  We did not need to really resect the whole thing.  At this point in time, the hysteroscope was removed.  Intrauterine curettings were then obtained, sent for pathological review.  At this point in time, the hydrothermal ablation device was put in place after dilating the cervix a bit further.  We clamped the cervix both with a Yates Decamp and a single-tooth tenaculum.  Initially, we had fluid loss.  We re- clamped it and then subsequently had no further fluid loss.  The hydrothermal ablation was then undertaken in the usual manner, which was completed after the cool down.  The hydrothermal ablation device was removed.  The single-tooth tenaculum and the speculum were removed.  The patient was taken out of the dorsal lithotomy position; once alert and extubated, transferred to the recovery room in good condition.  Sponge, instrument, and needle count was correct by circulating nurse x2.     Darlyn Chamber, M.D.     JSM/MEDQ  D:  05/09/2015  T:  05/09/2015  Job:  [id number]

## 2015-05-09 NOTE — Anesthesia Preprocedure Evaluation (Signed)
Anesthesia Evaluation  Patient identified by MRN, date of birth, ID band Patient awake    Reviewed: Allergy & Precautions, NPO status , Patient's Chart, lab work & pertinent test results  Airway Mallampati: I  TM Distance: >3 FB Neck ROM: Full    Dental no notable dental hx. (+) Teeth Intact   Pulmonary neg pulmonary ROS,    Pulmonary exam normal breath sounds clear to auscultation       Cardiovascular negative cardio ROS Normal cardiovascular exam Rhythm:Regular Rate:Normal     Neuro/Psych negative neurological ROS  negative psych ROS   GI/Hepatic negative GI ROS, Neg liver ROS,   Endo/Other  negative endocrine ROS  Renal/GU negative Renal ROS  negative genitourinary   Musculoskeletal negative musculoskeletal ROS (+)   Abdominal   Peds  Hematology  (+) anemia ,   Anesthesia Other Findings   Reproductive/Obstetrics Menorrhagia                              Anesthesia Physical Anesthesia Plan  ASA: II  Anesthesia Plan: General   Post-op Pain Management:    Induction: Intravenous  Airway Management Planned: LMA  Additional Equipment:   Intra-op Plan:   Post-operative Plan: Extubation in OR  Informed Consent: I have reviewed the patients History and Physical, chart, labs and discussed the procedure including the risks, benefits and alternatives for the proposed anesthesia with the patient or authorized representative who has indicated his/her understanding and acceptance.   Dental advisory given  Plan Discussed with: CRNA, Anesthesiologist and Surgeon  Anesthesia Plan Comments:         Anesthesia Quick Evaluation

## 2015-05-09 NOTE — Anesthesia Postprocedure Evaluation (Signed)
Anesthesia Post Note  Patient: [name]  Procedure(s) Performed: Procedure(s) (LRB): DILATATION & CURETTAGE/HYSTEROSCOPY WITH HYDROTHERMAL ABLATION (N/A) MYOSURE  Anesthesia type: General  Patient location: PACU  Post pain: Pain level controlled  Post assessment: Post-op Vital signs reviewed  Last Vitals:  Filed Vitals:   05/09/15 1030  BP: 82/53  Pulse: 52  Temp:   Resp: 18    Post vital signs: Reviewed  Level of consciousness: sedated  Complications: No apparent anesthesia complications

## 2015-05-09 NOTE — Brief Op Note (Signed)
05/09/2015  9:41 AM  PATIENT:  Virgina Norfolk  49 y.o. female  PRE-OPERATIVE DIAGNOSIS:  intrauterine process, menorrhagia  POST-OPERATIVE DIAGNOSIS:  intrauterine process, menorrhagia  PROCEDURE:  Procedure(s): DILATATION & CURETTAGE/HYSTEROSCOPY WITH HYDROTHERMAL ABLATION (N/A) MYOSURE  SURGEON:  Surgeon(s) and Role:    * Arvella Nigh, MD - Primary  PHYSICIAN ASSISTANT:   ASSISTANTS: none   ANESTHESIA:   general and paracervical block  EBL:  Total I/O In: 2000 [I.V.:2000] Out: 100 [Urine:100]  BLOOD ADMINISTERED:none  DRAINS: none   LOCAL MEDICATIONS USED:  XYLOCAINE   SPECIMEN:  Source of Specimen:  endometrial resections and currettings  DISPOSITION OF SPECIMEN:  PATHOLOGY  COUNTS:  YES  TOURNIQUET:  * No tourniquets in log *  DICTATION: .Other Dictation: Dictation Number [id number]  PLAN OF CARE: Discharge to home after PACU  PATIENT DISPOSITION:  PACU - hemodynamically stable.   Delay start of Pharmacological VTE agent (>24hrs) due to surgical blood loss or risk of bleeding: no

## 2015-05-10 ENCOUNTER — Encounter (HOSPITAL_COMMUNITY): Payer: Self-pay | Admitting: Obstetrics and Gynecology

## 2015-08-10 ENCOUNTER — Other Ambulatory Visit: Payer: Self-pay | Admitting: Obstetrics and Gynecology

## 2015-08-10 DIAGNOSIS — R928 Other abnormal and inconclusive findings on diagnostic imaging of breast: Secondary | ICD-10-CM

## 2015-08-16 ENCOUNTER — Ambulatory Visit
Admission: RE | Admit: 2015-08-16 | Discharge: 2015-08-16 | Disposition: A | Discharge status: Home / Self Care | Payer: 59 | Source: Ambulatory Visit | Attending: Obstetrics and Gynecology | Admitting: Obstetrics and Gynecology

## 2015-08-16 DIAGNOSIS — R928 Other abnormal and inconclusive findings on diagnostic imaging of breast: Secondary | ICD-10-CM

## 2016-02-04 ENCOUNTER — Ambulatory Visit (INDEPENDENT_AMBULATORY_CARE_PROVIDER_SITE_OTHER): Payer: 59 | Admitting: Family Medicine

## 2016-02-04 ENCOUNTER — Encounter: Payer: Self-pay | Admitting: Family Medicine

## 2016-02-04 VITALS — BP 120/78 | HR 95 | Temp 98.7°F | Wt 144.2 lb

## 2016-02-04 DIAGNOSIS — Z872 Personal history of diseases of the skin and subcutaneous tissue: Secondary | ICD-10-CM | POA: Diagnosis not present

## 2016-02-04 DIAGNOSIS — N63 Unspecified lump in breast: Secondary | ICD-10-CM | POA: Diagnosis not present

## 2016-02-04 DIAGNOSIS — N6320 Unspecified lump in the left breast, unspecified quadrant: Secondary | ICD-10-CM

## 2016-02-04 NOTE — Progress Notes (Signed)
   Subjective:    Patient ID: [name], female    DOB: June 07, 1966, 50 y.o.   MRN: [id number]  HPI Chief Complaint  Patient presents with  . Breast Mass    lump in left breast   She is here with complaints of finding a lump to her left breast approximately 1 1/2 weeks ago.  Denies tenderness or pain. Denies nipple discharge or skin changes. Denies fever, chills, weight change, fatigue, chest pain, shortness of breath, cough, abdominal pain. Last mammogram was fall of 2016. History of benign cysts to both breasts.   Denies family history of breast cancer.   Past Medical History:  Diagnosis Date  . Endometrial polyp   . Iron deficiency anemia   . Wears contact lenses    Past Surgical History:  Procedure Laterality Date  . BREAST BIOPSY Right 08/13/2012   Procedure: BREAST BIOPSY WITH NEEDLE LOCALIZATION;  Surgeon: Edward Jolly, MD;  Location: [facility];  Service: General;  Laterality: Right;   lobular neoplasia  . CESAREAN SECTION   1988/  02-13-2003/   06-27-2004   LAST C/S  W/ BILATERAL TUBAL LIGATION  . DILATATION & CURETTAGE/HYSTEROSCOPY WITH MYOSURE  05/09/2015   Procedure: Jacklynn Barnacle;  Surgeon: Arvella Nigh, MD;  Location: [facility];  Service: Gynecology;;  . DILATATION & CURRETTAGE/HYSTEROSCOPY WITH RESECTOCOPE N/A 08/15/2013   Procedure: [place];  Surgeon: Darlyn Chamber, MD;  Location: [facility];  Service: Gynecology;  Laterality: N/A;  . DILITATION & CURRETTAGE/HYSTROSCOPY WITH HYDROTHERMAL ABLATION N/A 05/09/2015   Procedure: DILATATION & CURETTAGE/HYSTEROSCOPY WITH HYDROTHERMAL ABLATION;  Surgeon: Arvella Nigh, MD;  Location: [facility];  Service: Gynecology;  Laterality: N/A;     Review of Systems Pertinent positives and negatives in the history of present illness.     Objective:   Physical Exam  Constitutional: She appears well-developed and well-nourished. No distress.    Pulmonary/Chest: Left breast exhibits mass. Left breast exhibits no nipple discharge, no skin change and no tenderness.    Approximately 5 cm x 2.5 cm breat cyst, nontender. No skin changes   BP 120/78   Pulse 95   Temp 98.7 F (37.1 C) (Oral)   Wt 144 lb 3.2 oz (65.4 kg)   BMI 24.18 kg/m       Assessment & Plan:  Breast mass, left - Plan: US BREAST LTD UNI LEFT INC AXILLA, MM DIAG BREAST TOMO UNI LEFT, CANCELED: MM DIGITAL SCREENING BILATERAL  History of cyst of breast - Plan: US BREAST LTD UNI LEFT INC AXILLA, MM DIAG BREAST TOMO UNI LEFT, CANCELED: MM DIGITAL SCREENING BILATERAL  Plan to refer for [place] of left breast.  She is not ill appearing. She has history of benign cysts to bilateral breasts. Will follow up pending results.  Appointment for [place] scheduled for 02/07/2016 at the breast center.

## 2016-02-07 ENCOUNTER — Ambulatory Visit
Admission: RE | Admit: 2016-02-07 | Discharge: 2016-02-07 | Disposition: A | Discharge status: Home / Self Care | Payer: 59 | Source: Ambulatory Visit | Attending: Family Medicine | Admitting: Family Medicine

## 2016-02-07 DIAGNOSIS — N6320 Unspecified lump in the left breast, unspecified quadrant: Secondary | ICD-10-CM

## 2016-02-07 DIAGNOSIS — Z872 Personal history of diseases of the skin and subcutaneous tissue: Secondary | ICD-10-CM

## 2016-03-04 ENCOUNTER — Other Ambulatory Visit: Payer: Self-pay | Admitting: Family Medicine

## 2016-03-04 DIAGNOSIS — N6002 Solitary cyst of left breast: Secondary | ICD-10-CM

## 2016-03-12 ENCOUNTER — Ambulatory Visit
Admission: RE | Admit: 2016-03-12 | Discharge: 2016-03-12 | Disposition: A | Discharge status: Home / Self Care | Payer: 59 | Source: Ambulatory Visit | Attending: Family Medicine | Admitting: Family Medicine

## 2016-03-12 DIAGNOSIS — N6002 Solitary cyst of left breast: Secondary | ICD-10-CM

## 2016-05-05 ENCOUNTER — Encounter: Payer: 59 | Admitting: Family Medicine

## 2016-06-23 HISTORY — PX: MULTIPLE TOOTH EXTRACTIONS: SHX2053

## 2016-06-30 ENCOUNTER — Encounter: Payer: 59 | Admitting: Family Medicine

## 2016-10-22 ENCOUNTER — Telehealth: Payer: Self-pay | Admitting: Family Medicine

## 2016-10-22 NOTE — Telephone Encounter (Signed)
Pt called left Voice mail at 4.59 to  Cancel her CPE with dr Tomi Bamberger, wanted to rescdeule, I called her back she did not answer I called and left her a message to we could get back on the schedule, canceled her appt for may 08/2016

## 2016-10-23 ENCOUNTER — Encounter: Payer: 59 | Admitting: Family Medicine

## 2017-04-08 DIAGNOSIS — Z23 Encounter for immunization: Secondary | ICD-10-CM | POA: Diagnosis not present

## 2017-07-27 ENCOUNTER — Encounter: Payer: Self-pay | Admitting: Gastroenterology

## 2017-08-11 ENCOUNTER — Other Ambulatory Visit: Payer: Self-pay | Admitting: Family Medicine

## 2017-08-11 DIAGNOSIS — Z1231 Encounter for screening mammogram for malignant neoplasm of breast: Secondary | ICD-10-CM

## 2017-08-12 ENCOUNTER — Encounter: Payer: Self-pay | Admitting: Gastroenterology

## 2017-09-07 ENCOUNTER — Ambulatory Visit
Admission: RE | Admit: 2017-09-07 | Discharge: 2017-09-07 | Disposition: A | Discharge status: Home / Self Care | Payer: 59 | Source: Ambulatory Visit | Attending: Family Medicine | Admitting: Family Medicine

## 2017-09-07 DIAGNOSIS — Z1231 Encounter for screening mammogram for malignant neoplasm of breast: Secondary | ICD-10-CM

## 2017-09-13 NOTE — Progress Notes (Signed)
Chief Complaint  Patient presents with  . Annual Exam    fasting annul exam with pap, last pap 2016. Just had eye exam with Dr. Einar Gip. Has been very fatigued lately. And has some tingling and numbness in her arms and hands-right worse than left.     [name] is a 52 y.o. female who presents for a complete physical.  She last had physical in 04/2015, just prior to having D&C, hysteroscopy, with hydrothermal ablation by Dr. Radene Knee.  Prior to this, she had h/o iron deficiency anemia.  She hasn't had periods since the ablation.  She has noted some hot flashes in the last 5-6 months.  Getting a little more frequent recently. She had seen Vickie in 01/2016 with left breast cyst, and subsequently underwent aspiration in 02/2016. No further breast concerns. Her husband lost his job and she didn't have any insurance last year. She doesn't plan to follow up with Dr. Radene Knee, requesting pap smear to be done here today.  She is complaining of fatigue in the last 2-3 months. She falls asleep fine, but is woken up by her husband waking up early for work.  Only sleeping 5 hours/night.  Once she made sure to get to bed earlier, and felt better.  She wakes up feeling tired.  No snoring.  She has been having some right arm stiffness and tingling up and down the arm, into the hand for a couple of months.  Some stiffness in the morning.  Denies weakness.  Denies neck pain.  Only occasionally happens during the day, more at night.  Immunization History  Administered Date(s) Administered  . Influenza Whole 03/23/2010  . Influenza,inj,Quad PF,6+ Mos 02/14/2013  . Influenza-Unspecified 04/13/2015  . Tdap 02/10/2011   Got flu shot at [facility]. Last Pap smear: Dr. Radene Knee, not since 2016 Last mammogram: 08/2017 Last colonoscopy: 06/2012 Dr. Valorie Roosevelt diverticulosis  Prep was very poor, so rec 5 year f/u with double prep. She is scheduled for appointment soon. 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 Vitamin D level was 40 in 2012  Lipid: Lab Results  Component Value Date   CHOL 134 02/01/2015   HDL 88 02/01/2015   LDLCALC 41 02/01/2015   TRIG 26 02/01/2015   CHOLHDL 1.5 02/01/2015   Past Medical History:  Diagnosis Date  . Endometrial polyp   . Iron deficiency anemia   . Wears contact lenses     Past Surgical History:  Procedure Laterality Date  . BREAST BIOPSY Right 08/13/2012   Procedure: BREAST BIOPSY WITH NEEDLE LOCALIZATION;  Surgeon: Edward Jolly, MD;  Location: [facility];  Service: General;  Laterality: Right;   lobular neoplasia  . CESAREAN SECTION   1988/  02-13-2003/   06-27-2004   LAST C/S  W/ BILATERAL TUBAL LIGATION  . DILATATION & CURETTAGE/HYSTEROSCOPY WITH MYOSURE  05/09/2015   Procedure: Jacklynn Barnacle;  Surgeon: Arvella Nigh, MD;  Location: [facility];  Service: Gynecology;;  . DILATATION & CURRETTAGE/HYSTEROSCOPY WITH RESECTOCOPE N/A 08/15/2013   Procedure: [place];  Surgeon: Darlyn Chamber, MD;  Location: [facility];  Service: Gynecology;  Laterality: N/A;  . DILITATION & CURRETTAGE/HYSTROSCOPY WITH HYDROTHERMAL ABLATION N/A 05/09/2015   Procedure: DILATATION & CURETTAGE/HYSTEROSCOPY WITH HYDROTHERMAL ABLATION;  Surgeon: Arvella Nigh, MD;  Location: [facility];  Service: Gynecology;  Laterality: N/A;  . MULTIPLE TOOTH EXTRACTIONS  2018  had all upper teeth pulled    Social History   Socioeconomic History  . Marital status: Married    Spouse name: Not on file  . Number of children: 3  . Years of education: Not on file  . Highest education level: Not on file  Occupational History  . Occupation: Emergency planning/management officer  Social Needs  . Financial resource strain: Not on file  . Food insecurity:    Worry: Not on file    Inability: Not on file  . Transportation needs:     Medical: Not on file    Non-medical: Not on file  Tobacco Use  . Smoking status: Never Smoker  . Smokeless tobacco: Never Used  Substance and Sexual Activity  . Alcohol use: No    Alcohol/week: 0.0 oz  . Drug use: No  . Sexual activity: Yes    Partners: Male    Birth control/protection: Surgical  Lifestyle  . Physical activity:    Days per week: Not on file    Minutes per session: Not on file  . Stress: Not on file  Relationships  . Social connections:    Talks on phone: Not on file    Gets together: Not on file    Attends religious service: Not on file    Active member of club or organization: Not on file    Attends meetings of clubs or organizations: Not on file    Relationship status: Not on file  . Intimate partner violence:    Fear of current or ex partner: Not on file    Emotionally abused: Not on file    Physically abused: Not on file    Forced sexual activity: Not on file  Other Topics Concern  . Not on file  Social History Narrative   Lives with husband, 2 sons (oldest son is grown, is in prison). 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    Boys are 13, 14, very active/busy.    Family History  Problem Relation Age of Onset  . Hyperlipidemia Mother   . Hypertension Mother   . Arthritis Mother   . Colon cancer Mother 31  . Cancer Mother        colon  . Hypertension Father   . Hyperlipidemia Father   . Cancer Father 80       leukemia (?CLL?)  . Stroke Brother   . Stroke Maternal Aunt 74  . Heart disease Maternal Aunt 72       CABG  . Stroke Maternal Grandmother   . Hypertension Brother   . Asthma Son        exercise-induced  . Diabetes Neg Hx     Outpatient Encounter Medications as of 09/14/2017  Medication Sig Note  . Biotin 1000 MCG tablet Take 1,000 mcg by mouth 3 (three) times daily.   Marland Kitchen glucosamine-chondroitin 500-400 MG tablet Take 1 tablet by mouth 3 (three) times daily.   Marland Kitchen loratadine (CLARITIN) 10 MG tablet Take 10 mg by mouth daily.   . Multiple  Vitamins-Minerals (MULTIVITAMIN WITH MINERALS) tablet Take 1 tablet by mouth daily.     . Thiamine HCl (VITAMIN B-1) 250 MG tablet Take 250 mg by mouth daily.   Marland Kitchen ibuprofen (ADVIL,MOTRIN) 800 MG tablet Take 800 mg by mouth every 8 (eight) hours as needed for moderate pain.   Marland Kitchen OVER THE COUNTER MEDICATION Take 2 tablets by mouth daily. Hydroxycut 05/02/2015: Uses occasionally  . [DISCONTINUED] ferrous fumarate (HEMOCYTE - 106 MG FE) 325 (106 FE) MG TABS tablet  Take 1 tablet by mouth daily. 05/02/2015: Remembers to take it about 4x/wk   No facility-administered encounter medications on file as of 09/14/2017.     No Known Allergies   ROS: The patient denies anorexia, fever, weight changes, headaches, vision changes, decreased hearing, ear pain, sore throat, chest pain, palpitations, dizziness, syncope, dyspnea on exertion, cough, swelling, nausea, vomiting, diarrhea, constipation, abdominal pain, melena, hematochezia, indigestion/heartburn, hematuria, dysuria, vaginal bleeding (s/p ablation), discharge, odor or itch, genital lesions, joint pains, weakness, tremor, suspicious skin lesions, depression, anxiety, abnormal bleeding/bruising, or enlarged lymph nodes.  +fatigue and tingling per HPI. Seasonal allergies--controlled with claritin.   PHYSICAL EXAM:  BP 110/70   Pulse 80   Ht 5' 4.5" (1.638 m)   Wt 146 lb 12.8 oz (66.6 kg)   BMI 24.81 kg/m   Wt Readings from Last 3 Encounters:  09/14/17 146 lb 12.8 oz (66.6 kg)  02/04/16 144 lb 3.2 oz (65.4 kg)  05/02/15 144 lb 12.8 oz (65.7 kg)     General Appearance:  Alert, cooperative, no distress, appears stated age   Head:  Normocephalic, without obvious abnormality, atraumatic   Eyes:  PERRL, conjunctiva/corneas clear, EOM's intact, fundi benign   Ears:  Normal TM's and external ear canals   Nose:  Nares normal, mucosa is moderately edematous, L>R, pale, clear mucus; no erythema, no drainage or sinus tenderness   Throat:  Lips,  mucosa, and tongue normal; teeth and gums normal. She has upper dentures  Neck:  Supple, no lymphadenopathy; thyroid: no enlargement/tenderness/ nodules; no carotid bruit or JVD   Back:  Spine nontender, no curvature, ROM normal, no CVA tenderness   Lungs:  Clear to auscultation bilaterally without wheezes, rales or ronchi; respirations unlabored   Chest Wall:  No tenderness or deformity   Heart:  Regular rate and rhythm, S1 and S2 normal, no murmur, rub or gallop   Breast Exam:  No nipple discharge, inversion, skin dimpling, breast masses or axillary lymphadenopathy. Nontender  Abdomen:  Soft, non-tender, nondistended, normoactive bowel sounds, no masses, no hepatosplenomegaly.   Genitalia:  Normal external genitalia. No lesions, vaginal discharge, cervical lesions, cervical motion tenderness, uterine or adnexal tenderness or masses.  Pap was obtained  Rectal:  Normal sphincter tone, no masses, heme negative stool  Extremities:  No clubbing, cyanosis or edema. +[place]alen bilaterally, causing tingling into both thumbs and index fingers  Pulses:  2+ and symmetric all extremities   Skin:  Skin color, texture, turgor normal, no rashes or lesions   Lymph nodes:  Cervical, supraclavicular, and axillary nodes normal   Neurologic:  CNII-XII intact, normal strength, sensation and gait; reflexes 2+ and symmetric throughout    Psych:  Normal mood, affect, hygiene and grooming   ASSESSMENT/PLAN:  Annual physical exam - Plan: POCT Urinalysis DIP (Proadvantage Device), VITAMIN D 25 Hydroxy (Vit-D Deficiency, Fractures), CBC with Differential/Platelet, TSH, Comprehensive metabolic panel, Lipid panel, Ferritin, Cytology - PAP([facility])  History of iron deficiency anemia - Plan: CBC with Differential/Platelet, Ferritin  Fatigue, unspecified type - suspect related to not getting enough sleep; rec going to bed earlier. Check labs. - Plan: VITAMIN D 25 Hydroxy  (Vit-D Deficiency, Fractures), CBC with Differential/Platelet, TSH, Comprehensive metabolic panel  Bilateral carpal tunnel syndrome - discussed wrist braces, NSAIDs prn, f/u if persistent/worsening or any weakness develops   CBC, c-met, TSH, lipids, ferritin, vit D Pap performed   Discussed monthly self breast exams and yearly mammograms; at least 30 minutes of aerobic activity at least 5 days/week, weight-bearing  exercise 2x/wk; proper sunscreen use reviewed; healthy diet, including goals of calcium and vitamin D intake and alcohol recommendations (less than or equal to 1 drink/day) reviewed; regular seatbelt use; changing batteries in smoke detectors. Immunization recommendations discussed, continue yearly flu shots and Shingrix recommended (to check with insurance).  Colonoscopy due (Dr. Sharlett Iles), scheduled (needs double prep).  F/u 1 year

## 2017-09-14 ENCOUNTER — Ambulatory Visit (INDEPENDENT_AMBULATORY_CARE_PROVIDER_SITE_OTHER): Payer: BLUE CROSS/BLUE SHIELD | Admitting: Family Medicine

## 2017-09-14 ENCOUNTER — Encounter: Payer: Self-pay | Admitting: Family Medicine

## 2017-09-14 ENCOUNTER — Other Ambulatory Visit (HOSPITAL_COMMUNITY)
Admission: RE | Admit: 2017-09-14 | Discharge: 2017-09-14 | Disposition: A | Discharge status: Home / Self Care | Payer: BLUE CROSS/BLUE SHIELD | Source: Ambulatory Visit | Attending: Family Medicine | Admitting: Family Medicine

## 2017-09-14 VITALS — BP 110/70 | HR 80 | Ht 64.5 in | Wt 146.8 lb

## 2017-09-14 DIAGNOSIS — G5603 Carpal tunnel syndrome, bilateral upper limbs: Secondary | ICD-10-CM

## 2017-09-14 DIAGNOSIS — Z0000 Encounter for general adult medical examination without abnormal findings: Secondary | ICD-10-CM | POA: Insufficient documentation

## 2017-09-14 DIAGNOSIS — R5383 Other fatigue: Secondary | ICD-10-CM | POA: Diagnosis not present

## 2017-09-14 DIAGNOSIS — Z862 Personal history of diseases of the blood and blood-forming organs and certain disorders involving the immune mechanism: Secondary | ICD-10-CM

## 2017-09-14 LAB — POCT URINALYSIS DIP (PROADVANTAGE DEVICE)
Bilirubin, UA: NEGATIVE
GLUCOSE UA: NEGATIVE mg/dL
Ketones, POC UA: NEGATIVE mg/dL
LEUKOCYTES UA: NEGATIVE
NITRITE UA: NEGATIVE
Protein Ur, POC: NEGATIVE mg/dL
RBC UA: NEGATIVE
Specific Gravity, Urine: 1.02
UUROB: NEGATIVE
pH, UA: 8 (ref 5.0–8.0)

## 2017-09-14 NOTE — Patient Instructions (Addendum)
HEALTH MAINTENANCE RECOMMENDATIONS:  It is recommended that you get at least 30 minutes of aerobic exercise at least 5 days/week (for weight loss, you may need as much as 60-90 minutes). This can be any activity that gets your heart rate up. This can be divided in 10-15 minute intervals if needed, but try and build up your endurance at least once a week.  Weight bearing exercise is also recommended twice weekly.  Eat a healthy diet with lots of vegetables, fruits and fiber.  "Colorful" foods have a lot of vitamins (ie green vegetables, tomatoes, red peppers, etc).  Limit sweet tea, regular sodas and alcoholic beverages, all of which has a lot of calories and sugar.  Up to 1 alcoholic drink daily may be beneficial for women (unless trying to lose weight, watch sugars).  Drink a lot of water.  Calcium recommendations are 1200-1500 mg daily (1500 mg for postmenopausal women or women without ovaries), and vitamin D 1000 IU daily.  This should be obtained from diet and/or supplements (vitamins), and calcium should not be taken all at once, but in divided doses.  Monthly self breast exams and yearly mammograms for women over the age of 37 is recommended.  Sunscreen of at least SPF 30 should be used on all sun-exposed parts of the skin when outside between the hours of 10 am and 4 pm (not just when at beach or pool, but even with exercise, golf, tennis, and yard work!)  Use a sunscreen that says "broad spectrum" so it covers both UVA and UVB rays, and make sure to reapply every 1-2 hours.  Remember to change the batteries in your smoke detectors when changing your clock times in the spring and fall.  Use your seat belt every time you are in a car, and please drive safely and not be distracted with cell phones and texting while driving.  I recommend getting the new shingles vaccine (Shingrix). You will need to check with your insurance to see if it is covered, and if covered by Medicare Part D, you need to  get from the pharmacy rather than our office.  It is a series of 2 injections, spaced 2 months apart.  Call here to get your name on the list for when it is available if you would like to get it from our office.  Try and go to bed earlier.  Ideally you should get 8 hours/night.   Carpal Tunnel Syndrome Carpal tunnel syndrome is a condition that causes pain in your hand and arm. The carpal tunnel is a narrow area located on the palm side of your wrist. Repeated wrist motion or certain diseases may cause swelling within the tunnel. This swelling pinches the main nerve in the wrist (median nerve). What are the causes? This condition may be caused by:  Repeated wrist motions.  Wrist injuries.  Arthritis.  A cyst or tumor in the carpal tunnel.  Fluid buildup during pregnancy.  Sometimes the cause of this condition is not known. What increases the risk? This condition is more likely to develop in:  People who have jobs that cause them to repeatedly move their wrists in the same motion, such as Art gallery manager.  Women.  People with certain conditions, such as: ? Diabetes. ? Obesity. ? An underactive thyroid (hypothyroidism). ? Kidney failure.  What are the signs or symptoms? Symptoms of this condition include:  A tingling feeling in your fingers, especially in your thumb, index, and middle fingers.  Tingling or  numbness in your hand.  An aching feeling in your entire arm, especially when your wrist and elbow are bent for long periods of time.  Wrist pain that goes up your arm to your shoulder.  Pain that goes down into your palm or fingers.  A weak feeling in your hands. You may have trouble grabbing and holding items.  Your symptoms may feel worse during the night. How is this diagnosed? This condition is diagnosed with a medical history and physical exam. You may also have tests, including:  An electromyogram (EMG). This test measures electrical signals sent by  your nerves into the muscles.  X-rays.  How is this treated? Treatment for this condition includes:  Lifestyle changes. It is important to stop doing or modify the activity that caused your condition.  Physical or occupational therapy.  Medicines for pain and inflammation. This may include medicine that is injected into your wrist.  A wrist splint.  Surgery.  Follow these instructions at home: If you have a splint:  Wear it as told by your health care provider. Remove it only as told by your health care provider.  Loosen the splint if your fingers become numb and tingle, or if they turn cold and blue.  Keep the splint clean and dry. General instructions  Take over-the-counter and prescription medicines only as told by your health care provider.  Rest your wrist from any activity that may be causing your pain. If your condition is work related, talk to your employer about changes that can be made, such as getting a wrist pad to use while typing.  If directed, apply ice to the painful area: ? Put ice in a plastic bag. ? Place a towel between your skin and the bag. ? Leave the ice on for 20 minutes, 2-3 times per day.  Keep all follow-up visits as told by your health care provider. This is important.  Do any exercises as told by your health care provider, physical therapist, or occupational therapist. Contact a health care provider if:  You have new symptoms.  Your pain is not controlled with medicines.  Your symptoms get worse. This information is not intended to replace advice given to you by your health care provider. Make sure you discuss any questions you have with your health care provider. Document Released: 06/06/2000 Document Revised: 10/18/2015 Document Reviewed: 10/25/2014 Elsevier Interactive Patient Education  [employer].

## 2017-09-15 LAB — CBC WITH DIFFERENTIAL/PLATELET
BASOS ABS: 0 10*3/uL (ref 0.0–0.2)
Basos: 1 %
EOS (ABSOLUTE): 0.1 10*3/uL (ref 0.0–0.4)
Eos: 2 %
HEMOGLOBIN: 14 g/dL (ref 11.1–15.9)
Hematocrit: 40.8 % (ref 34.0–46.6)
IMMATURE GRANULOCYTES: 0 %
Immature Grans (Abs): 0 10*3/uL (ref 0.0–0.1)
LYMPHS ABS: 1.2 10*3/uL (ref 0.7–3.1)
LYMPHS: 41 %
MCH: 32.9 pg (ref 26.6–33.0)
MCHC: 34.3 g/dL (ref 31.5–35.7)
MCV: 96 fL (ref 79–97)
MONOCYTES: 7 %
Monocytes Absolute: 0.2 10*3/uL (ref 0.1–0.9)
Neutrophils Absolute: 1.4 10*3/uL (ref 1.4–7.0)
Neutrophils: 49 %
Platelets: 273 10*3/uL (ref 150–379)
RBC: 4.25 x10E6/uL (ref 3.77–5.28)
RDW: 12.1 % — ABNORMAL LOW (ref 12.3–15.4)
WBC: 3 10*3/uL — ABNORMAL LOW (ref 3.4–10.8)

## 2017-09-15 LAB — COMPREHENSIVE METABOLIC PANEL
ALK PHOS: 72 IU/L (ref 39–117)
ALT: 17 IU/L (ref 0–32)
AST: 20 IU/L (ref 0–40)
Albumin/Globulin Ratio: 1.6 (ref 1.2–2.2)
Albumin: 4.4 g/dL (ref 3.5–5.5)
BILIRUBIN TOTAL: 0.5 mg/dL (ref 0.0–1.2)
BUN/Creatinine Ratio: 14 (ref 9–23)
BUN: 13 mg/dL (ref 6–24)
CHLORIDE: 100 mmol/L (ref 96–106)
CO2: 26 mmol/L (ref 20–29)
Calcium: 9.7 mg/dL (ref 8.7–10.2)
Creatinine, Ser: 0.92 mg/dL (ref 0.57–1.00)
GFR calc non Af Amer: 72 mL/min/{1.73_m2} (ref >59)
GFR, EST AFRICAN AMERICAN: 83 mL/min/{1.73_m2} (ref >59)
GLUCOSE: 89 mg/dL (ref 65–99)
Globulin, Total: 2.7 g/dL (ref 1.5–4.5)
Potassium: 4.6 mmol/L (ref 3.5–5.2)
Sodium: 139 mmol/L (ref 134–144)
TOTAL PROTEIN: 7.1 g/dL (ref 6.0–8.5)

## 2017-09-15 LAB — LIPID PANEL
CHOLESTEROL TOTAL: 160 mg/dL (ref 100–199)
Chol/HDL Ratio: 2 ratio (ref 0.0–4.4)
HDL: 81 mg/dL (ref >39)
LDL Calculated: 74 mg/dL (ref 0–99)
Triglycerides: 24 mg/dL (ref 0–149)
VLDL CHOLESTEROL CAL: 5 mg/dL (ref 5–40)

## 2017-09-15 LAB — TSH: TSH: 0.672 u[IU]/mL (ref 0.450–4.500)

## 2017-09-15 LAB — CYTOLOGY - PAP
DIAGNOSIS: NEGATIVE
HPV: NOT DETECTED

## 2017-09-15 LAB — FERRITIN: Ferritin: 17 ng/mL (ref 15–150)

## 2017-09-15 LAB — VITAMIN D 25 HYDROXY (VIT D DEFICIENCY, FRACTURES): VIT D 25 HYDROXY: 51 ng/mL (ref 30.0–100.0)

## 2017-09-16 ENCOUNTER — Encounter: Payer: 59 | Admitting: Family Medicine

## 2017-09-17 ENCOUNTER — Ambulatory Visit (AMBULATORY_SURGERY_CENTER): Payer: Self-pay | Admitting: *Deleted

## 2017-09-17 ENCOUNTER — Other Ambulatory Visit: Payer: Self-pay

## 2017-09-17 VITALS — Ht 64.5 in | Wt 146.0 lb

## 2017-09-17 DIAGNOSIS — Z800 Family history of malignant neoplasm of digestive organs: Secondary | ICD-10-CM

## 2017-09-17 MED ORDER — NA SULFATE-K SULFATE-MG SULF 17.5-3.13-1.6 GM/177ML PO SOLN
1.0000 | Freq: Once | ORAL | 0 refills | Status: AC
Start: 2017-09-17 — End: 2017-09-17

## 2017-09-17 NOTE — Progress Notes (Signed)
Denies allergies to eggs or soy products. Denies complications with sedation or anesthesia. Denies O2 use. Denies use of diet or weight loss medications.  Emmi instructions given for colonoscopy.  

## 2017-09-22 ENCOUNTER — Encounter: Payer: Self-pay | Admitting: Gastroenterology

## 2017-09-29 ENCOUNTER — Telehealth: Payer: Self-pay | Admitting: Gastroenterology

## 2017-09-29 NOTE — Telephone Encounter (Signed)
Returned patients call. Patient was instructed to continue on clear liquids until three hours before her procedure which will be 8:00 Am. Patient was instructed to drink the second part of her prep at 6:00 am five hours before her procedure. Patient verbalizes understanding.   Riki Sheer, LPN ( admitting )

## 2017-09-30 ENCOUNTER — Encounter: Payer: Self-pay | Admitting: Gastroenterology

## 2017-09-30 ENCOUNTER — Other Ambulatory Visit: Payer: Self-pay

## 2017-09-30 ENCOUNTER — Ambulatory Visit (AMBULATORY_SURGERY_CENTER): Payer: BLUE CROSS/BLUE SHIELD | Admitting: Gastroenterology

## 2017-09-30 VITALS — BP 116/75 | HR 73 | Temp 96.8°F | Resp 15 | Ht 64.0 in | Wt 146.0 lb

## 2017-09-30 DIAGNOSIS — Z1211 Encounter for screening for malignant neoplasm of colon: Secondary | ICD-10-CM | POA: Diagnosis not present

## 2017-09-30 DIAGNOSIS — Z800 Family history of malignant neoplasm of digestive organs: Secondary | ICD-10-CM

## 2017-09-30 MED ORDER — SODIUM CHLORIDE 0.9 % IV SOLN: 500.0000 mL | Freq: Once | INTRAVENOUS | Status: DC

## 2017-09-30 NOTE — Progress Notes (Signed)
Pt's states no medical or surgical changes since previsit or office visit. 

## 2017-09-30 NOTE — Op Note (Signed)
[name] Procedure Date: 09/30/2017 10:47 AM MRN: [id number] Endoscopist: Mallie Mussel L. [name] , MD Age: 52 Referring MD:  Date of Birth: 04-22-1966 Gender: Female Account #: [id number] Procedure:                Colonoscopy Indications:              Screening in patient at increased risk: Colorectal                            cancer in mother 37 or older Medicines:                Monitored Anesthesia Care Procedure:                Pre-Anesthesia Assessment:                           - Prior to the procedure, a History and Physical                            was performed, and patient medications and                            allergies were reviewed. The patient's tolerance of                            previous anesthesia was also reviewed. The risks                            and benefits of the procedure and the sedation                            options and risks were discussed with the patient.                            All questions were answered, and informed consent                            was obtained. Prior Anticoagulants: The patient has                            taken no previous anticoagulant or antiplatelet                            agents. ASA Grade Assessment: II - A patient with                            mild systemic disease. After reviewing the risks                            and benefits, the patient was deemed in                            satisfactory condition to undergo the procedure.  After obtaining informed consent, the colonoscope                            was passed under direct vision. Throughout the                            procedure, the patient's blood pressure, pulse, and                            oxygen saturations were monitored continuously. The                            Colonoscope was introduced through the anus and                            advanced to the the cecum,  identified by                            appendiceal orifice and ileocecal valve. The                            colonoscopy was performed without difficulty. The                            patient tolerated the procedure well. The quality                            of the bowel preparation was excellent. The                            ileocecal valve, appendiceal orifice, and rectum                            were photographed. The bowel preparation used was                            SUPREP. Scope In: 10:53:34 AM Scope Out: 11:08:20 AM Scope Withdrawal Time: 0 hours 9 minutes 47 seconds  Total Procedure Duration: 0 hours 14 minutes 46 seconds  Findings:                 The perianal and digital rectal examinations were                            normal.                           The entire examined colon appeared normal on direct                            and retroflexion views. Complications:            No immediate complications. Estimated Blood Loss:     Estimated blood loss: none. Impression:               - The entire examined colon is normal on direct and  retroflexion views.                           - No specimens collected. Recommendation:           - Patient has a contact number available for                            emergencies. The signs and symptoms of potential                            delayed complications were discussed with the                            patient. Return to normal activities tomorrow.                            Written discharge instructions were provided to the                            patient.                           - Resume previous diet.                           - Continue present medications.                           - Repeat colonoscopy in 5 years for screening                            purposes. Henry L. Selena Carrow, MD 09/30/2017 11:10:35 AM This report has been signed electronically.

## 2017-09-30 NOTE — Patient Instructions (Signed)
YOU HAD AN ENDOSCOPIC PROCEDURE TODAY AT [facility]:   Refer to the procedure report that was given to you for any specific questions about what was found during the examination.  If the procedure report does not answer your questions, please call your gastroenterologist to clarify.  If you requested that your care partner not be given the details of your procedure findings, then the procedure report has been included in a sealed envelope for you to review at your convenience later.  YOU SHOULD EXPECT: Some feelings of bloating in the abdomen. Passage of more gas than usual.  Walking can help get rid of the air that was put into your GI tract during the procedure and reduce the bloating. If you had a lower endoscopy (such as a colonoscopy or flexible sigmoidoscopy) you may notice spotting of blood in your stool or on the toilet paper. If you underwent a bowel prep for your procedure, you may not have a normal bowel movement for a few days.  Please Note:  You might notice some irritation and congestion in your nose or some drainage.  This is from the oxygen used during your procedure.  There is no need for concern and it should clear up in a day or so.  SYMPTOMS TO REPORT IMMEDIATELY:   Following lower endoscopy (colonoscopy or flexible sigmoidoscopy):  Excessive amounts of blood in the stool  Significant tenderness or worsening of abdominal pains  Swelling of the abdomen that is new, acute  Fever of 100F or higher  For urgent or emergent issues, a gastroenterologist can be reached at any hour by calling [phone].   DIET:  We do recommend a small meal at first, but then you may proceed to your regular diet.  Drink plenty of fluids but you should avoid alcoholic beverages for 24 hours.  ACTIVITY:  You should plan to take it easy for the rest of today and you should NOT DRIVE or use heavy machinery until tomorrow (because of the sedation medicines used during the test).     FOLLOW UP: Our staff will call the number listed on your records the next business day following your procedure to check on you and address any questions or concerns that you may have regarding the information given to you following your procedure. If we do not reach you, we will leave a message.  However, if you are feeling well and you are not experiencing any problems, there is no need to return our call.  We will assume that you have returned to your regular daily activities without incident.  If any biopsies were taken you will be contacted by phone or by letter within the next 1-3 weeks.  Please call us at [phone] if you have not heard about the biopsies in 3 weeks.   Repeat next screening Colonoscopy in 5 years.   SIGNATURES/CONFIDENTIALITY: You and/or your care partner have signed paperwork which will be entered into your electronic medical record.  These signatures attest to the fact that that the information above on your After Visit Summary has been reviewed and is understood.  Full responsibility of the confidentiality of this discharge information lies with you and/or your care-partner.

## 2017-09-30 NOTE — Progress Notes (Signed)
A/ox3 pleased with MAC, report to [facility]

## 2017-10-01 ENCOUNTER — Telehealth: Payer: Self-pay | Admitting: *Deleted

## 2017-10-01 ENCOUNTER — Telehealth: Payer: Self-pay

## 2017-10-01 NOTE — Telephone Encounter (Signed)
  Follow up Call-  Call back number 09/30/2017  Post procedure Call Back phone  # [phone]  Permission to leave phone message Yes  Some recent data might be hidden   [facility]

## 2017-10-01 NOTE — Telephone Encounter (Signed)
Attempted to reach patient for post-procedure f/u call (this is the 2nd call). No answer. Left message for her to please not hesitate to call us if she has any questions/concerns regarding her care.

## 2017-10-23 ENCOUNTER — Telehealth: Payer: Self-pay | Admitting: Family Medicine

## 2017-10-23 NOTE — Telephone Encounter (Signed)
Pt dropped of form to be filled out put in your folder pt can be reached at [phone]  Informed pt that you was out of the office until Monday

## 2017-10-26 NOTE — Telephone Encounter (Signed)
I forgot I received this--the form is definitely for this patient (despite the name looking like Nevin Bloodgood on the form). Form only requires nurse signature, so please fill out. thanks

## 2017-11-20 ENCOUNTER — Telehealth: Payer: Self-pay | Admitting: Family Medicine

## 2017-11-20 NOTE — Telephone Encounter (Signed)
Pt came in and stated that her son was accepted into the [employer] of [employer]forming [employer]. She will be going with him and will be staying on campus for 4 weeks. They are requiring a student cpe form be completed. Pt was advised the Dr. Tomi Bamberger is out of town. Sending form back to [place] to possibly complete. Please advise pt at [phone].

## 2017-11-23 NOTE — Telephone Encounter (Signed)
I put this form on your desk. Would you like to fill out of prefer I wait until Dr.Knapp returns and let patient know. Please inform.

## 2018-03-25 ENCOUNTER — Encounter: Payer: Self-pay | Admitting: *Deleted

## 2018-09-14 NOTE — Patient Instructions (Addendum)
  HEALTH MAINTENANCE RECOMMENDATIONS:  It is recommended that you get at least 30 minutes of aerobic exercise at least 5 days/week (for weight loss, you may need as much as 60-90 minutes). This can be any activity that gets your heart rate up. This can be divided in 10-15 minute intervals if needed, but try and build up your endurance at least once a week.  Weight bearing exercise is also recommended twice weekly.  Eat a healthy diet with lots of vegetables, fruits and fiber.  "Colorful" foods have a lot of vitamins (ie green vegetables, tomatoes, red peppers, etc).  Limit sweet tea, regular sodas and alcoholic beverages, all of which has a lot of calories and sugar.  Up to 1 alcoholic drink daily may be beneficial for women (unless trying to lose weight, watch sugars).  Drink a lot of water.  Calcium recommendations are 1200-1500 mg daily (1500 mg for postmenopausal women or women without ovaries), and vitamin D 1000 IU daily.  This should be obtained from diet and/or supplements (vitamins), and calcium should not be taken all at once, but in divided doses.  Monthly self breast exams and yearly mammograms for women over the age of 19 is recommended.  Sunscreen of at least SPF 30 should be used on all sun-exposed parts of the skin when outside between the hours of 10 am and 4 pm (not just when at beach or pool, but even with exercise, golf, tennis, and yard work!)  Use a sunscreen that says "broad spectrum" so it covers both UVA and UVB rays, and make sure to reapply every 1-2 hours.  Remember to change the batteries in your smoke detectors when changing your clock times in the spring and fall. Carbon monoxide detectors are recommended for your home.  Use your seat belt every time you are in a car, and please drive safely and not be distracted with cell phones and texting while driving.  I recommend getting the new shingles vaccine (Shingrix). You will need to check with your insurance to see if it  is covered, and if covered, schedule a nurse visit at our office when convenient.  It is a series of 2 injections, spaced 2 months apart.  Use the Flonase daily--2 sprays, gentle sniffs, into each nostril once a day. You may use sudafed along with the flonase and claritin (vs changing to claritin D) if you still have significant congestion.

## 2018-09-14 NOTE — Progress Notes (Signed)
Chief Complaint  Patient presents with  . Annual Exam    fasting annual exam with pap. Sees eye doctor annually. No concerns.     [name] is a 53 y.o. female who presents for a complete physical.  She has the following concerns:  She has history of iron deficiency anemia.  She is s/p ablation and had resolution of anemia (normal CBC and ferritin 08/2017).  She no longer has any vaginal bleeding. Has hot flashes, no night sweats.  Tolerable.  Carpal tunnel syndrome--last year recommended braces and NSAIDs prn.  She reports doing well over the past year. 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  She uses the braces at night.  Not needing any NSAIDs.  Denies weakness.   Immunization History  Administered Date(s) Administered  . Influenza Whole 03/23/2010  . Influenza,inj,Quad PF,6+ Mos 02/14/2013, 04/08/2017  . Influenza-Unspecified 04/13/2015, 03/23/2018  . Tdap 02/10/2011   Last Pap smear: 08/2017, normal, no high risk HPV detected Last mammogram: 08/2017 Last colonoscopy: 09/2017 Dr. Almyra Free. 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 Since COVID-19, hasn't been getting exercise. Plans to walk and do aerobics at home.  Vitamin D level was 51 in 08/2017 Lipid: Lab Results  Component Value Date   CHOL 160 09/14/2017   HDL 81 09/14/2017   LDLCALC 74 09/14/2017   TRIG 24 09/14/2017   CHOLHDL 2.0 09/14/2017   Past Medical History:  Diagnosis Date  . Endometrial polyp   . Iron deficiency anemia   . Wears contact lenses     Past Surgical History:  Procedure Laterality Date  . BREAST BIOPSY Right 08/13/2012   Procedure: BREAST BIOPSY WITH NEEDLE LOCALIZATION;  Surgeon: Edward Jolly, MD;  Location: [facility];  Service: General;   Laterality: Right;   lobular neoplasia  . CESAREAN SECTION   1988/  02-13-2003/   06-27-2004   LAST C/S  W/ BILATERAL TUBAL LIGATION  . DILATATION & CURETTAGE/HYSTEROSCOPY WITH MYOSURE  05/09/2015   Procedure: Jacklynn Barnacle;  Surgeon: Arvella Nigh, MD;  Location: [facility];  Service: Gynecology;;  . DILATATION & CURRETTAGE/HYSTEROSCOPY WITH RESECTOCOPE N/A 08/15/2013   Procedure: [place];  Surgeon: Darlyn Chamber, MD;  Location: [facility];  Service: Gynecology;  Laterality: N/A;  . DILITATION & CURRETTAGE/HYSTROSCOPY WITH HYDROTHERMAL ABLATION N/A 05/09/2015   Procedure: DILATATION & CURETTAGE/HYSTEROSCOPY WITH HYDROTHERMAL ABLATION;  Surgeon: Arvella Nigh, MD;  Location: [facility];  Service: Gynecology;  Laterality: N/A;  . MULTIPLE TOOTH EXTRACTIONS  2018   had all upper teeth pulled    Social History   Socioeconomic History  . Marital status: Married    Spouse name: Not on file  . Number of children: 3  . Years of education: Not on file  . Highest education level: Not on file  Occupational History  . Occupation: Emergency planning/management officer  Social Needs  . Financial resource strain: Not on file  . Food insecurity:    Worry: Not on file    Inability: Not on file  . Transportation needs:    Medical: Not on file    Non-medical: Not on file  Tobacco Use  . Smoking status: Never Smoker  . Smokeless tobacco: Never Used  Substance and Sexual Activity  . Alcohol use: No  Alcohol/week: 0.0 standard drinks  . Drug use: No  . Sexual activity: Yes    Partners: Male    Birth control/protection: Surgical  Lifestyle  . Physical activity:    Days per week: Not on file    Minutes per session: Not on file  . Stress: Not on file  Relationships  . Social connections:    Talks on phone: Not on file    Gets together: Not on file    Attends religious service: Not on file    Active member of club or organization: Not on file    Attends meetings of clubs  or organizations: Not on file    Relationship status: Not on file  . Intimate partner violence:    Fear of current or ex partner: Not on file    Emotionally abused: Not on file    Physically abused: Not on file    Forced sexual activity: Not on file  Other Topics Concern  . Not on file  Social History Narrative   Lives with husband, 2 sons (has 3 sons; oldest son is grown, is in prison). 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    Boys are 14, 15, very active/busy.    Family History  Problem Relation Age of Onset  . Hyperlipidemia Mother   . Hypertension Mother   . Arthritis Mother   . Colon cancer Mother 61  . Cancer Mother        colon  . Hypertension Father   . Hyperlipidemia Father   . Cancer Father 8       leukemia (?CLL?)  . Stroke Brother   . Stroke Maternal Aunt 74  . Heart disease Maternal Aunt 72       CABG  . Stroke Maternal Grandmother   . Hypertension Brother   . Asthma Son        exercise-induced  . Diabetes Neg Hx   . Esophageal cancer Neg Hx   . Rectal cancer Neg Hx   . Stomach cancer Neg Hx     Outpatient Encounter Medications as of 09/15/2018  Medication Sig Note  . Biotin 1000 MCG tablet Take 1,000 mcg by mouth daily.    Marland Kitchen glucosamine-chondroitin 500-400 MG tablet Take 1 tablet by mouth daily.    Marland Kitchen loratadine (CLARITIN) 10 MG tablet Take 10 mg by mouth daily.   . Multiple Vitamins-Minerals (MULTIVITAMIN WITH MINERALS) tablet Take 1 tablet by mouth daily.     Marland Kitchen OVER THE COUNTER MEDICATION Take 2 tablets by mouth daily. Hydroxycut 05/02/2015: Uses occasionally  . OVER THE COUNTER MEDICATION Take 1 capsule by mouth daily. 09/15/2018: [employer] (similar to hydroxycut, uses one or the other)  . Thiamine HCl (VITAMIN B-1) 250 MG tablet Take 250 mg by mouth daily.   Marland Kitchen ibuprofen (ADVIL,MOTRIN) 800 MG tablet Take 800 mg by mouth every 8 (eight) hours as needed for moderate pain.    Facility-Administered Encounter Medications as of 09/15/2018  Medication  . 0.9 %  sodium  chloride infusion    No Known Allergies   ROS: The patient denies anorexia, fever, weight changes, headaches, vision changes, decreased hearing, ear pain, sore throat, chest pain, palpitations, dizziness, syncope, dyspnea on exertion, cough, swelling, nausea, vomiting, diarrhea, constipation, abdominal pain, melena, hematochezia, indigestion/heartburn, hematuria, dysuria, vaginal bleeding (s/p ablation), discharge, odor or itch, genital lesions, joint pains, weakness, tremor, suspicious skin lesions, depression, anxiety, abnormal bleeding/bruising, or enlarged lymph nodes.  Seasonal allergies--controlled with claritin. Hot flashes are mild/tolerable Numbness in fingers R>L due to CTS  per HPI   PHYSICAL EXAM:  BP 124/70   Pulse 72   Ht 5' 4.5" (1.638 m)   Wt 150 lb 12.8 oz (68.4 kg)   BMI 25.49 kg/m   Wt Readings from Last 3 Encounters:  09/15/18 150 lb 12.8 oz (68.4 kg)  09/30/17 146 lb (66.2 kg)  09/17/17 146 lb (66.2 kg)    General Appearance:  Alert, cooperative, no distress, appears stated age   Head:  Normocephalic, without obvious abnormality, atraumatic   Eyes:  PERRL, conjunctiva/corneas clear, EOM's intact, fundi benign   Ears:  Normal TM's and external ear canals   Nose:  Nares normal, mucosa is moderate-severely edematous bilaterally, pale; no erythema, no drainage or sinus tenderness   Throat:  Lips, mucosa, and tongue normal; teeth and gums normal. She has upper dentures  Neck:  Supple, no lymphadenopathy; thyroid: no enlargement/tenderness/ nodules; no carotid bruit or JVD   Back:  Spine nontender, no curvature, ROM normal, no CVA tenderness   Lungs:  Clear to auscultation bilaterally without wheezes, rales or ronchi; respirations unlabored   Chest Wall:  No tenderness or deformity   Heart:  Regular rate and rhythm, S1 and S2 normal, no murmur, rub or gallop   Breast Exam:  No nipple discharge, inversion, skin dimpling, breast masses or  axillary lymphadenopathy. Nontender. WHSS right breast  Abdomen:  Soft, non-tender, nondistended, normoactive bowel sounds, no masses, no hepatosplenomegaly.   Genitalia:  Normal external genitalia. No lesions, vaginal discharge, cervical motion tenderness, uterine or adnexal tenderness or masses.  Pap not performed  Rectal:  Normal sphincter tone, no masses, heme negative stool  Extremities:  No clubbing, cyanosis or edema. +[place] on right, causing tingling into first 3 fingers on the right  Pulses:  2+ and symmetric all extremities   Skin:  Skin color, texture, turgor normal, no rashes or lesions   Lymph nodes:  Cervical, supraclavicular, and axillary nodes normal   Neurologic:   CNII-XII intact, normal strength, sensation and gait; reflexes 2+ and symmetric throughout    Psych: Normal mood, affect, hygiene and grooming   ASSESSMENT/PLAN:  Annual physical exam - Plan: POCT Urinalysis DIP (Proadvantage Device)  Seasonal allergies - continue claritin; add flonase (pt has some at home already, hasn't used); sudafed prn - Plan: fluticasone (FLONASE) 50 MCG/ACT nasal spray  Carpal tunnel syndrome, unspecified laterality - cont braces prn.  Advised to contact us if develops any weakness, pain, worsening symptoms  Reminded to schedule yearly mammogram.  Discussed monthly self breast exams and yearly mammograms; at least 30 minutes of aerobic activity at least 5 days/week, weight-bearing exercise 2x/wk; proper sunscreen use reviewed; healthy diet, including goals of calcium and vitamin D intake and alcohol recommendations (less than or equal to 1 drink/day) reviewed; regular seatbelt use; changing batteries in smoke detectors. Immunization recommendations discussed, continue yearly flu shots and Shingrix recommended (risks/SE reviewed; check insurance and schedule NV).  Colonoscopy UTD, next due 09/2022 Pap next due 08/2022  F/u 1 year

## 2018-09-15 ENCOUNTER — Ambulatory Visit: Payer: 59 | Admitting: Family Medicine

## 2018-09-15 ENCOUNTER — Encounter: Payer: Self-pay | Admitting: Family Medicine

## 2018-09-15 ENCOUNTER — Other Ambulatory Visit: Payer: Self-pay

## 2018-09-15 VITALS — BP 124/70 | HR 72 | Ht 64.5 in | Wt 150.8 lb

## 2018-09-15 DIAGNOSIS — J302 Other seasonal allergic rhinitis: Secondary | ICD-10-CM

## 2018-09-15 DIAGNOSIS — Z0000 Encounter for general adult medical examination without abnormal findings: Secondary | ICD-10-CM | POA: Diagnosis not present

## 2018-09-15 DIAGNOSIS — G5600 Carpal tunnel syndrome, unspecified upper limb: Secondary | ICD-10-CM | POA: Diagnosis not present

## 2018-09-15 LAB — POCT URINALYSIS DIP (PROADVANTAGE DEVICE)
BILIRUBIN UA: NEGATIVE
GLUCOSE UA: NEGATIVE mg/dL
Ketones, POC UA: NEGATIVE mg/dL
Leukocytes, UA: NEGATIVE
NITRITE UA: NEGATIVE
Protein Ur, POC: NEGATIVE mg/dL
RBC UA: NEGATIVE
Specific Gravity, Urine: 1.015
Urobilinogen, Ur: NEGATIVE
pH, UA: 7.5 (ref 5.0–8.0)

## 2018-09-15 MED ORDER — FLUTICASONE PROPIONATE 50 MCG/ACT NA SUSP: 2.0000 | Freq: Every day | NASAL | 11 refills | Status: DC

## 2019-01-19 ENCOUNTER — Other Ambulatory Visit: Payer: Self-pay | Admitting: Family Medicine

## 2019-01-19 DIAGNOSIS — Z1231 Encounter for screening mammogram for malignant neoplasm of breast: Secondary | ICD-10-CM

## 2019-03-07 ENCOUNTER — Other Ambulatory Visit: Payer: Self-pay

## 2019-03-07 ENCOUNTER — Ambulatory Visit
Admission: RE | Admit: 2019-03-07 | Discharge: 2019-03-07 | Disposition: A | Discharge status: Home / Self Care | Payer: 59 | Source: Ambulatory Visit | Attending: Family Medicine | Admitting: Family Medicine

## 2019-03-07 DIAGNOSIS — Z1231 Encounter for screening mammogram for malignant neoplasm of breast: Secondary | ICD-10-CM

## 2019-06-01 ENCOUNTER — Other Ambulatory Visit: Payer: Self-pay

## 2019-06-01 DIAGNOSIS — Z20822 Contact with and (suspected) exposure to covid-19: Secondary | ICD-10-CM

## 2019-06-02 LAB — NOVEL CORONAVIRUS, NAA: SARS-CoV-2, NAA: NOT DETECTED

## 2019-09-17 NOTE — Progress Notes (Signed)
Chief Complaint  Patient presents with  . Annual Exam    fasting annual exam with pelvic, no new concerns. She did have DEXA in 2011-I will look in old chart. (nothing in old chart)   [name] is a 54 y.o. female who presents for a complete physical.  She has no concerns today.  She has history of iron deficiency anemia.  She is s/p ablation and had resolution of anemia (normal CBC and ferritin 08/2017).  She no longer has any vaginal bleeding. Has mild hot flashes, no night sweats.   She previously had issues with carpal tunnel syndrome. 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  She uses the braces at night only prn. Denies numbness, tingling, weakness or pain.  Immunization History  Administered Date(s) Administered  . Influenza Inj Mdck Quad Pf 03/26/2019  . Influenza Whole 03/23/2010  . Influenza,inj,Quad PF,6+ Mos 02/14/2013, 04/08/2017  . Influenza-Unspecified 04/13/2015, 03/23/2018  . PFIZER SARS-COV-2 Vaccination 09/04/2019  . Tdap 02/10/2011   Last Pap smear: 08/2017, normal, no high risk HPV detected Last mammogram:02/2019 Last colonoscopy: 09/2017 Dr. Almyra Free. Repeat 5 years (due to FHx) Last DEXA: heel scren normal in 06/2006 at [facility] Dentist: yearly for cleaning (has dentures on top) Ophtho: yearly Exercise: Currently exercising 5-6 days/week (day 37 of 90 day program). Cardio and weights (30 mins cardio). Intermittent fasting (eating 1-7pm, sometimes has smoothies before 1).  Vitamin D level was 51 in 08/2017 Lipid: Lab Results  Component Value Date   CHOL 160 09/14/2017   HDL 81 09/14/2017   LDLCALC 74 09/14/2017   TRIG 24 09/14/2017   CHOLHDL 2.0 09/14/2017   PMH, PSH, SH and FH reviewed and updated  Outpatient Encounter Medications as of 09/19/2019  Medication Sig Note  . Biotin 1000 MCG tablet Take 1,000 mcg by mouth daily.    .  fluticasone (FLONASE) 50 MCG/ACT nasal spray Place 2 sprays into both nostrils daily.   Marland Kitchen loratadine (CLARITIN) 10 MG tablet Take 10 mg by mouth daily.   . Multiple Vitamins-Minerals (MULTIVITAMIN WITH MINERALS) tablet Take 1 tablet by mouth daily.     . [DISCONTINUED] OVER THE COUNTER MEDICATION Take 2 tablets by mouth daily. Hydroxycut 05/02/2015: Uses occasionally  . ibuprofen (ADVIL,MOTRIN) 800 MG tablet Take 800 mg by mouth every 8 (eight) hours as needed for moderate pain.   . [DISCONTINUED] glucosamine-chondroitin 500-400 MG tablet Take 1 tablet by mouth daily.    . [DISCONTINUED] OVER THE COUNTER MEDICATION Take 1 capsule by mouth daily. 09/15/2018: [employer] (similar to hydroxycut, uses one or the other)  . [DISCONTINUED] Thiamine HCl (VITAMIN B-1) 250 MG tablet Take 250 mg by mouth daily.    Facility-Administered Encounter Medications as of 09/19/2019  Medication  . 0.9 %  sodium chloride infusion   No Known Allergies   ROS: The patient denies anorexia, fever, weight changes (slight gain), headaches, vision changes, decreased hearing, ear pain, sore throat, chest pain, palpitations, dizziness, syncope, dyspnea on exertion, cough, swelling, nausea, vomiting, diarrhea, constipation, abdominal pain, melena, hematochezia, indigestion/heartburn, hematuria, dysuria, vaginal bleeding (s/p ablation),discharge, odor or itch, genital lesions, joint pains, weakness, tremor, suspicious skin lesions, depression, anxiety, abnormal bleeding/bruising, or enlarged lymph nodes.  Seasonal allergies--controlled with claritin and flonase. Hot flashes are mild/tolerable No longer having numbness in fingers.    PHYSICAL EXAM:  BP 112/78   Pulse 68   Temp 98 F (36.7 C) (Other (  Comment))   Ht 5' 4.5" (1.638 m)   Wt 152 lb 3.2 oz (69 kg)   BMI 25.72 kg/m   Wt Readings from Last 3 Encounters:  09/19/19 152 lb 3.2 oz (69 kg)  09/15/18 150 lb 12.8 oz (68.4 kg)  09/30/17 146 lb (66.2 kg)     General Appearance:  Alert, cooperative, no distress, appears stated age   Head:  Normocephalic, without obvious abnormality, atraumatic   Eyes:  PERRL, conjunctiva/corneas clear, EOM's intact, fundi benign   Ears:  Normal TM's and external ear canals   Nose:  Not examined, wearing mask due to COVID-19 pandemic   Throat:  Not examined, wearing mask due to COVID-19 pandemic  Neck:  Supple, no lymphadenopathy; thyroid: no enlargement/tenderness/ nodules; no carotid bruit or JVD   Back:  Spine nontender, no curvature, ROM normal, no CVA tenderness   Lungs:  Clear to auscultation bilaterally without wheezes, rales or ronchi; respirations unlabored   Chest Wall:  No tenderness or deformity   Heart:  Regular rate and rhythm, S1 and S2 normal, no murmur, rub or gallop   Breast Exam:  No nipple discharge, inversion, skin dimpling, breast masses or axillary lymphadenopathy. Nontender. WHSS right breast  Abdomen:  Soft, non-tender, nondistended, normoactive bowel sounds, no masses, no hepatosplenomegaly.   Genitalia:  Normal external genitalia. No lesions, vaginal discharge, cervical motion tenderness, uterine or adnexal tenderness or masses. Pap not performed  Rectal: Normal sphincter tone, no masses, heme negative stool  Extremities:  No clubbing, cyanosis or edema.  Pulses:  2+ and symmetric all extremities   Skin:  Skin color, texture, turgor normal, no rashes or lesions   Lymph nodes:  Cervical, supraclavicular, and axillary nodes normal   Neurologic:   CNII-XII intact, normal strength, sensation and gait; reflexes 2+ and symmetric throughout    Psych:  Normal mood, affect, hygiene and grooming   ASSESSMENT/PLAN:  Annual physical exam - Plan: CBC with Differential/Platelet, Comprehensive metabolic panel, Lipid panel  Seasonal allergies - controlled with claritin and flonase  Counseled re: healthy diet, exercise. Discussed  intermittent fasting, vs other healthy diets. Discussed her weight--doing more strength training so might see less change in scale, but will see change in inches. Questions answered.   Discussed monthly self breast exams and yearly mammograms; at least 30 minutes of aerobic activity at least 5 days/week, weight-bearing exercise 2x/wk; proper sunscreen use reviewed; healthy diet, including goals of calcium and vitamin D intake and alcohol recommendations (less than or equal to 1 drink/day) reviewed; regular seatbelt use; changing batteries in smoke detectors. Immunization recommendations discussed,continueyearly flu shots and Shingrixrecommended (risks/SE reviewed; check insurance and schedule NV at least 4 weeks after second COVID vaccine). Tetanus booster next year. Colonoscopy UTD, next due 09/2022 Pap next due 08/2022  F/u 1 year, sooner prn.

## 2019-09-17 NOTE — Patient Instructions (Addendum)
HEALTH MAINTENANCE RECOMMENDATIONS:  It is recommended that you get at least 30 minutes of aerobic exercise at least 5 days/week (for weight loss, you may need as much as 60-90 minutes). This can be any activity that gets your heart rate up. This can be divided in 10-15 minute intervals if needed, but try and build up your endurance at least once a week.  Weight bearing exercise is also recommended twice weekly.  Eat a healthy diet with lots of vegetables, fruits and fiber.  "Colorful" foods have a lot of vitamins (ie green vegetables, tomatoes, red peppers, etc).  Limit sweet tea, regular sodas and alcoholic beverages, all of which has a lot of calories and sugar.  Up to 1 alcoholic drink daily may be beneficial for women (unless trying to lose weight, watch sugars).  Drink a lot of water.  Calcium recommendations are 1200-1500 mg daily (1500 mg for postmenopausal women or women without ovaries), and vitamin D 1000 IU daily.  This should be obtained from diet and/or supplements (vitamins), and calcium should not be taken all at once, but in divided doses.  Monthly self breast exams and yearly mammograms for women over the age of 104 is recommended.  Sunscreen of at least SPF 30 should be used on all sun-exposed parts of the skin when outside between the hours of 10 am and 4 pm (not just when at beach or pool, but even with exercise, golf, tennis, and yard work!)  Use a sunscreen that says "broad spectrum" so it covers both UVA and UVB rays, and make sure to reapply every 1-2 hours.  Remember to change the batteries in your smoke detectors when changing your clock times in the spring and fall. Carbon monoxide detectors are recommended for your home.  Use your seat belt every time you are in a car, and please drive safely and not be distracted with cell phones and texting while driving.  I recommend getting the new shingles vaccine (Shingrix). You will need to check with your insurance to see if it  is covered, and if covered, schedule nurse visit at our office when convenient. You should wait 4 weeks after the second COVID vaccine before starting Shingrix.  It is a series of 2 injections, spaced 2 months apart.   MyPlate from [place] is an outline of a general healthy diet based on the 2010 Dietary Guidelines for Americans, from the U.S. Department of Agriculture Scientist, research (physical sciences)). It sets guidelines for how To follow MyPlate recommendations:  Eat a wide variety of fruits and vegetables, grains, and protein foods.  Serve smaller portions and eat less food throughout the day.  Limit portion sizes to avoid overeating.  Enjoy your food.  Get at least 150 minutes of exercise every week. This is about 30 minutes each day, 5 or more days per week. It can be difficult to have every meal look like MyPlate. Think about MyPlate as eating guidelines for an entire day, rather than each individual meal. Fruits and vegetables  Make half of your plate fruits and vegetables.  Eat many different colors of fruits and vegetables each day.  For a 2,000 calorie daily food plan, eat: ? 2 cups of vegetables every day. ? 2 cups of fruit every day.  1 cup is equal to: ? 1 cup raw or cooked vegetables. ? 1 cup raw fruit. ? 1 medium-sized orange, apple, or banana. ? 1 cup 100% fruit or vegetable juice. ? 2 cups raw leafy greens, such as  lettuce, spinach, or kale. ?  cup dried fruit. Grains  One fourth of your plate should be grains.  Make at least half of the grains you eat each day whole grains.  For a 2,000 calorie daily food plan, eat 6 oz of grains every day.  1 oz is equal to: ? 1 slice bread. ? 1 cup cereal. ?  cup cooked rice, cereal, or pasta. Protein  One fourth of your plate should be protein.  Eat a wide variety of protein foods, including meat, poultry, fish, eggs, beans, nuts, and tofu.  For a 2,000 calorie daily food plan, eat 5 oz of protein every day.  1 oz is equal  to: ? 1 oz meat, poultry, or fish. ?  cup cooked beans. ? 1 egg. ?  oz nuts or seeds. ? 1 Tbsp peanut butter. Dairy  Drink fat-free or low-fat (1%) milk.  Eat or drink dairy as a side to meals.  For a 2,000 calorie daily food plan, eat or drink 3 cups of dairy every day.  1 cup is equal to: ? 1 cup milk, yogurt, cottage cheese, or soy milk (soy beverage). ? 2 oz processed cheese. ? 1 oz natural cheese. Fats, oils, salt, and sugars  Only small amounts of oils are recommended.  Avoid foods that are high in calories and low in nutritional value (empty calories), like foods high in fat or added sugars.  Choose foods that are low in salt (sodium). Choose foods that have less than 140 milligrams (mg) of sodium per serving.  Drink water instead of sugary drinks. Drink enough water each day to keep your urine pale yellow. Where to find support  Work with your health care provider or a nutrition specialist (dietitian) to develop a customized eating plan that is right for you.  Download an app (mobile application) to help you track your daily food intake. Where to find more information  Go to [email or web address] for more information. Summary  MyPlate is a general guideline for healthy eating from the USDA. It is based on the 2010 Dietary Guidelines for Americans.  In general, fruits and vegetables should take up  of your plate, grains should take up  of your plate, and protein should take up  of your plate. This information is not intended to replace advice given to you by your health care provider. Make sure you discuss any questions you have with your health care provider. Document Revised: 11/11/2018 Document Reviewed: 09/08/2016 Elsevier Patient Education  [employer].

## 2019-09-19 ENCOUNTER — Encounter: Payer: Self-pay | Admitting: Family Medicine

## 2019-09-19 ENCOUNTER — Ambulatory Visit: Payer: 59 | Admitting: Family Medicine

## 2019-09-19 ENCOUNTER — Other Ambulatory Visit: Payer: Self-pay

## 2019-09-19 VITALS — BP 112/78 | HR 68 | Temp 98.0°F | Ht 64.5 in | Wt 152.2 lb

## 2019-09-19 DIAGNOSIS — Z0000 Encounter for general adult medical examination without abnormal findings: Secondary | ICD-10-CM | POA: Diagnosis not present

## 2019-09-19 DIAGNOSIS — J302 Other seasonal allergic rhinitis: Secondary | ICD-10-CM

## 2019-09-20 LAB — CBC WITH DIFFERENTIAL/PLATELET
Basophils Absolute: 0 10*3/uL (ref 0.0–0.2)
Basos: 1 %
EOS (ABSOLUTE): 0.1 10*3/uL (ref 0.0–0.4)
Eos: 2 %
Hematocrit: 40.5 % (ref 34.0–46.6)
Hemoglobin: 13.6 g/dL (ref 11.1–15.9)
Immature Grans (Abs): 0 10*3/uL (ref 0.0–0.1)
Immature Granulocytes: 0 %
Lymphocytes Absolute: 1.5 10*3/uL (ref 0.7–3.1)
Lymphs: 37 %
MCH: 31.7 pg (ref 26.6–33.0)
MCHC: 33.6 g/dL (ref 31.5–35.7)
MCV: 94 fL (ref 79–97)
Monocytes Absolute: 0.4 10*3/uL (ref 0.1–0.9)
Monocytes: 9 %
Neutrophils Absolute: 2.1 10*3/uL (ref 1.4–7.0)
Neutrophils: 51 %
Platelets: 276 10*3/uL (ref 150–450)
RBC: 4.29 x10E6/uL (ref 3.77–5.28)
RDW: 11.6 % — ABNORMAL LOW (ref 11.7–15.4)
WBC: 4.1 10*3/uL (ref 3.4–10.8)

## 2019-09-20 LAB — COMPREHENSIVE METABOLIC PANEL
ALT: 11 IU/L (ref 0–32)
AST: 21 IU/L (ref 0–40)
Albumin/Globulin Ratio: 1.5 (ref 1.2–2.2)
Albumin: 4.6 g/dL (ref 3.8–4.9)
Alkaline Phosphatase: 68 IU/L (ref 39–117)
BUN/Creatinine Ratio: 19 (ref 9–23)
BUN: 16 mg/dL (ref 6–24)
Bilirubin Total: 0.4 mg/dL (ref 0.0–1.2)
CO2: 23 mmol/L (ref 20–29)
Calcium: 9.8 mg/dL (ref 8.7–10.2)
Chloride: 100 mmol/L (ref 96–106)
Creatinine, Ser: 0.86 mg/dL (ref 0.57–1.00)
GFR calc Af Amer: 89 mL/min/{1.73_m2} (ref >59)
GFR calc non Af Amer: 77 mL/min/{1.73_m2} (ref >59)
Globulin, Total: 3 g/dL (ref 1.5–4.5)
Glucose: 93 mg/dL (ref 65–99)
Potassium: 4 mmol/L (ref 3.5–5.2)
Sodium: 137 mmol/L (ref 134–144)
Total Protein: 7.6 g/dL (ref 6.0–8.5)

## 2019-09-20 LAB — LIPID PANEL
Chol/HDL Ratio: 2.1 ratio (ref 0.0–4.4)
Cholesterol, Total: 149 mg/dL (ref 100–199)
HDL: 70 mg/dL (ref >39)
LDL Chol Calc (NIH): 72 mg/dL (ref 0–99)
Triglycerides: 23 mg/dL (ref 0–149)
VLDL Cholesterol Cal: 7 mg/dL (ref 5–40)

## 2020-01-31 ENCOUNTER — Other Ambulatory Visit: Payer: Self-pay | Admitting: Family Medicine

## 2020-01-31 DIAGNOSIS — Z1231 Encounter for screening mammogram for malignant neoplasm of breast: Secondary | ICD-10-CM

## 2020-03-12 ENCOUNTER — Other Ambulatory Visit: Payer: Self-pay

## 2020-03-12 ENCOUNTER — Ambulatory Visit
Admission: RE | Admit: 2020-03-12 | Discharge: 2020-03-12 | Disposition: A | Discharge status: Home / Self Care | Payer: 59 | Source: Ambulatory Visit | Attending: Family Medicine | Admitting: Family Medicine

## 2020-03-12 DIAGNOSIS — Z1231 Encounter for screening mammogram for malignant neoplasm of breast: Secondary | ICD-10-CM

## 2020-03-26 ENCOUNTER — Other Ambulatory Visit: Payer: Self-pay

## 2020-03-26 ENCOUNTER — Encounter: Payer: Self-pay | Admitting: Family Medicine

## 2020-03-26 ENCOUNTER — Other Ambulatory Visit: Payer: Self-pay | Admitting: *Deleted

## 2020-03-26 ENCOUNTER — Telehealth (INDEPENDENT_AMBULATORY_CARE_PROVIDER_SITE_OTHER): Payer: No Typology Code available for payment source | Admitting: Family Medicine

## 2020-03-26 ENCOUNTER — Other Ambulatory Visit: Payer: No Typology Code available for payment source

## 2020-03-26 VITALS — Temp 97.1°F | Ht 65.0 in | Wt 152.0 lb

## 2020-03-26 DIAGNOSIS — R059 Cough, unspecified: Secondary | ICD-10-CM

## 2020-03-26 DIAGNOSIS — J302 Other seasonal allergic rhinitis: Secondary | ICD-10-CM | POA: Diagnosis not present

## 2020-03-26 DIAGNOSIS — H1012 Acute atopic conjunctivitis, left eye: Secondary | ICD-10-CM | POA: Diagnosis not present

## 2020-03-26 LAB — POC COVID19 BINAXNOW: SARS Coronavirus 2 Ag: NEGATIVE

## 2020-03-26 MED ORDER — BENZONATATE 200 MG PO CAPS: 200.0000 mg | ORAL_CAPSULE | Freq: Three times a day (TID) | ORAL | 0 refills | Status: DC | PRN

## 2020-03-26 NOTE — Patient Instructions (Signed)
Drink plenty of water. Continue the flonase and use it every day (throughout the Fall allergy season).  You can continue to take claritin along with if it, if needed (can back off to just "as needed" use if allergies calm down, but keep the Flonase going through the season). You may use phenylephrine (sudafed PE, or the Theraflu that you have, but that also contains benadryl that might make you sleepy) if needed for sinus pain. Sinus rinses such as Sinus Rinse kit or Neti-pot can also be helpful for sinus pain.  I sent in a prescription for Benzonatate (cough medication we discussed) to your [facility].  Try cool compresses for the eye irritation/swelling.  If you are having itching or increasing discomfort you can try antihistamine drops (ie Nafcon or Visine-A (allergy/antihistamine)

## 2020-03-26 NOTE — Progress Notes (Signed)
Start time: 12:02-12:07 unable to connect to video, had to call pt; d/w pt to send photo of eye and do telephone call.  She figured out the video later--2nd attempt started 12:28 End time: 12:56  Virtual Visit via Video Note  I connected with [name] on 03/26/20  by a video enabled telemedicine application and verified that I am speaking with the correct person using two identifiers.  Location: Patient: home Provider: office   I discussed the limitations of evaluation and management by telemedicine and the availability of in person appointments. The patient expressed understanding and agreed to proceed.  History of Present Illness:  Chief Complaint  Patient presents with   Cough    VIRTUAL cough and lots of congestion. HA and eye is puffy and red in the corner. Symptoms started this past Friday. No fever. Mucus is clear.    3 days ago she started with puffiness under her eyes, and redness in the inner corner of the left eye. There is slight crusting there in the morning.  Eye isn't itchy or watery. She also has cough and congestion. Mucus is clear, cough is mostly nonproductive.  She has PND, which is thick but also clear when expectorated. Coughing is mainly at night.  Yesterday she developed a headache on the left side, in her forehead and cheek. Currently doesn't have a headache.   No known sick contacts. Son had similar symptoms 2 weeks ago for a few days, improved with regular use of allergy meds.  She is currently taking Claritin, has been taking daily for a long time. Uses the flonase more just as needed, sporadic prior to Friday.  She took Theraflu (contains acetaminophen, diphenhydramine (PM pack), phenylephrine)2 doses yesterday), and Mucinex DM (last night and today)  PMH, PSH, SH reviewed  Outpatient Encounter Medications as of 03/26/2020  Medication Sig   Biotin 1000 MCG tablet Take 1,000 mcg by mouth daily.    fluticasone (FLONASE) 50 MCG/ACT  nasal spray Place 2 sprays into both nostrils daily.   loratadine (CLARITIN) 10 MG tablet Take 10 mg by mouth daily.   Multiple Vitamins-Minerals (MULTIVITAMIN WITH MINERALS) tablet Take 1 tablet by mouth daily.     benzonatate (TESSALON) 200 MG capsule Take 1 capsule (200 mg total) by mouth 3 (three) times daily as needed for cough.   ibuprofen (ADVIL,MOTRIN) 800 MG tablet Take 800 mg by mouth every 8 (eight) hours as needed for moderate pain. (Patient not taking: Reported on 03/26/2020)   Facility-Administered Encounter Medications as of 03/26/2020  Medication   0.9 %  sodium chloride infusion   See above for OTC meds.  Was NOT taking tessalon prior to today's visit.  No Known Allergies  ROS: No fever, chills.  Headache and URI symptoms per HPI.  Denies SOB or DOE.  No loss of smell or taste. No nausea, vomiting, diarrhea, bleeding, bruising or rashes. Denies myalgias.  Chest is a little sore from coughing.   Observations/Objective:  Temp (!) 97.1 F (36.2 C) (Temporal)    Ht _0  (1.651 m)    Wt 152 lb (68.9 kg)    BMI 25.29 kg/m   Pleasant, well-appearing female in no distress. She is coughing frequently during visit, sounds dry. HEENT: conjunctiva and sclera are clear, except for mild conjunctival injection at the medial/nasal portion of the left eye.  No crusting or soft tissue swelling. There is slight puffiness beneath both eyes She is alert, oriented, normal cranial nerves. She is speaking easily,  in no distress Exam is limited due to virtual nature of the visit.  Assessment and Plan:  Cough - Ddx reviewed, including viral URI (including poss COVID), vs allergies vs other/bacterial. Supportive measures, contact us if worse/changes - Plan: benzonatate (TESSALON) 200 MG capsule  Seasonal allergies - to use Flonase daily, in addition to claritin. Discussed daily use of Flonase for prevention/treatment rather than prn  Allergic conjunctivitis of left eye - Flonase should  help, can also use OTC anithistamines, cool compresses. f/u if worsening, increased crusting, pain  Pt will come for COVID test.   Drink plenty of water. Continue the flonase and use it every day (throughout the Fall allergy season).  You can continue to take claritin along with if it, if needed (can back off to just "as needed" use if allergies calm down, but keep the Flonase going through the season). You may use phenylephrine (sudafed PE, or the Theraflu that you have, but that also contains benadryl that might make you sleepy) if needed for sinus pain. Sinus rinses such as Sinus Rinse kit or Neti-pot can also be helpful for sinus pain.  I sent in a prescription for Benzonatate (cough medication we discussed) to your [facility].  Try cool compresses for the eye irritation/swelling.  If you are having itching or increasing discomfort you can try antihistamine drops (ie Nafcon or Visine-A (allergy/antihistamine)    Follow Up Instructions:    I discussed the assessment and treatment plan with the patient. The patient was provided an opportunity to ask questions and all were answered. The patient agreed with the plan and demonstrated an understanding of the instructions.   The patient was advised to call back or seek an in-person evaluation if the symptoms worsen or if the condition fails to improve as anticipated.  I provided 28 minutes of video face-to-face time during this encounter. Additional time spent in chart review, documentation.  Vikki Ports, MD

## 2020-03-27 LAB — NOVEL CORONAVIRUS, NAA: SARS-CoV-2, NAA: NOT DETECTED

## 2020-03-27 LAB — SARS-COV-2, NAA 2 DAY TAT

## 2020-04-05 ENCOUNTER — Telehealth: Payer: Self-pay | Admitting: Family Medicine

## 2020-04-05 NOTE — Telephone Encounter (Signed)
Spoke with patient-- No longer tight/dry. Now getting a lot drainage. Mucus is still clear (mostly coughing it up, rather than blowing it out). Coughing a lot, worse at night.  Using Flonase. Using Mucinex DM (though forgot some nights).  Forgot it last night and her husband said her cough was really bad last night (does help when she takes it).  Sleeps better, but still there when she wakes up. Ran out of claritin last week.  Discussed allergies, chronicity, s/sx infection. To restart claritin, discussed sudafed prn, and continue Mucinex DM.

## 2020-04-05 NOTE — Telephone Encounter (Signed)
Pt called and states she is still having a cough esp at night and she has finished the tessalon pills, she is wanting to know if you will call her something else in pt uses [place], [address] AT [place] She can be reached at [phone]

## 2020-05-02 ENCOUNTER — Encounter: Payer: Self-pay | Admitting: Family Medicine

## 2020-05-02 ENCOUNTER — Ambulatory Visit: Payer: No Typology Code available for payment source | Admitting: Family Medicine

## 2020-05-02 ENCOUNTER — Other Ambulatory Visit: Payer: Self-pay

## 2020-05-02 VITALS — BP 110/70 | HR 80 | Ht 64.5 in | Wt 157.4 lb

## 2020-05-02 DIAGNOSIS — L989 Disorder of the skin and subcutaneous tissue, unspecified: Secondary | ICD-10-CM

## 2020-05-02 DIAGNOSIS — Z23 Encounter for immunization: Secondary | ICD-10-CM

## 2020-05-02 DIAGNOSIS — L089 Local infection of the skin and subcutaneous tissue, unspecified: Secondary | ICD-10-CM | POA: Diagnosis not present

## 2020-05-02 MED ORDER — SULFAMETHOXAZOLE-TRIMETHOPRIM 800-160 MG PO TABS: 1.0000 | ORAL_TABLET | Freq: Two times a day (BID) | ORAL | 0 refills | Status: DC

## 2020-05-02 NOTE — Patient Instructions (Signed)
The appearance of the lesion on the finger isn't typical for a wart.  This may partly be due to the treatments done prior to today's exam. Given the surrounding swelling and tenderness, I'm concerned there may be an underlying infection (pus at the base of the lesion).  Do warm/soapy soaks of the finger 3-4 times daily. Take the antibiotics as directed. Keep it elevated if throbbing/painful. Use ibuprofen and/or tylenol if needed for pain.  Return in 2 weeks for recheck (sooner if getting worse.  Feel free to send photos through [place]) We will give you your COVID booster at your follow-up visit.

## 2020-05-02 NOTE — Progress Notes (Signed)
Chief Complaint  Patient presents with  . Wart    has a wart on her right hand.   3 weeks ago she noticed a bump on her right index finger that was slightly pink. She thought it was a wart, used topical treatment (bandaid, Compound W) It softened it, then she started using some clippers to pull off dead skin.   She also used A&D ointment, which helped with discomfort--got sore after using the wart treatment.  Now it is hard, feels sore. She denies any drainage.   It stings when in water. Throbs some, especially when hand is down, feels discomfort extend into the nail.  PMH, PSH, SH reviewed  Outpatient Encounter Medications as of 05/02/2020  Medication Sig  . Biotin 1000 MCG tablet Take 1,000 mcg by mouth daily.   . fluticasone (FLONASE) 50 MCG/ACT nasal spray Place 2 sprays into both nostrils daily.  Marland Kitchen loratadine (CLARITIN) 10 MG tablet Take 10 mg by mouth daily.  . Multiple Vitamins-Minerals (MULTIVITAMIN WITH MINERALS) tablet Take 1 tablet by mouth daily.    Marland Kitchen ibuprofen (ADVIL,MOTRIN) 800 MG tablet Take 800 mg by mouth every 8 (eight) hours as needed for moderate pain. (Patient not taking: Reported on 03/26/2020)  . [DISCONTINUED] benzonatate (TESSALON) 200 MG capsule Take 1 capsule (200 mg total) by mouth 3 (three) times daily as needed for cough.   Facility-Administered Encounter Medications as of 05/02/2020  Medication  . 0.9 %  sodium chloride infusion   No Known Allergies  ROS: no fever, chills, URI symptoms, chest pain, shortness of breath, GI complaints, other skin concerns, bleeding, bruising, rash.  PHYSICAL EXAM:  BP 110/70   Pulse 80   Ht 5' 4.5" (1.638 m)   Wt 157 lb 6.4 oz (71.4 kg)   BMI 26.60 kg/m   Well-appearing, pleasant female in no distress R index finger-- 0.5 cm area of central hyperkeratosis, though is smooth (thickened skin, smooth, not verrucous).  There is surrounding swelling, hyperpigmentation, total area measuring  1.5 x 1.5 cm Lesion is at  radial aspect of right index finger at DIP Brisk capillary refill.  No fluctuance or warmth  ASSESSMENT/PLAN:  Skin lesion of right upper extremity  Need for influenza vaccination - Plan: Flu Vaccine QUAD 6+ mos PF IM (Fluarix Quad PF)  Infection of skin - Plan: sulfamethoxazole-trimethoprim (BACTRIM DS) 800-160 MG tablet  Flu shot today [place] for COVID booster in 2 weeks.  The appearance of the lesion on the finger isn't typical for a wart.  This may partly be due to the treatments done prior to today's exam. Given the surrounding swelling and tenderness, I'm concerned there may be an underlying infection (pus at the base of the lesion).  Do warm/soapy soaks of the finger 3-4 times daily. Take the antibiotics as directed. Keep it elevated if throbbing/painful. Use ibuprofen and/or tylenol if needed for pain.  Return in 2 weeks for recheck (sooner if getting worse.  Feel free to send photos through [place]) We will give you your COVID booster at your follow-up visit.

## 2020-05-22 NOTE — Progress Notes (Signed)
Chief Complaint  Patient presents with  . Follow-up    finger not better, took 2 ibuprofen daily all last week, only thing to help with pain.     She presents today for f/u on lesion on R index finger, and to get her COVID booster shot.  Seen 11/10 for bump on her R index finger that had been present x 3 weeks. She thought it was a wart, used topical treatment (bandaid, Compound W). It softened it, then she started using some clippers to pull off dead skin.   She also used A&D ointment, which helped with discomfort--got sore after using the wart treatment. At her visit it was hard, sore, not draining, and would sting when in water.  It was throbbing/painful. She was treated with Bactrim for possible underlying infection, and was advised to soak the finger.  It remains painful. She has been needing to take ibuprofen all last week, which is effective in treating the pain.  She thinks the bump has gotten smaller.  There was never any drainage, though she did recall two times where there was a lot of bleeding (she thinks related to picking at it).  PMH, PSH, SH reviewed  Outpatient Encounter Medications as of 05/23/2020  Medication Sig  . Biotin 1000 MCG tablet Take 1,000 mcg by mouth daily.   . fluticasone (FLONASE) 50 MCG/ACT nasal spray Place 2 sprays into both nostrils daily.  Marland Kitchen ibuprofen (ADVIL) 200 MG tablet Take 200 mg by mouth every 6 (six) hours as needed.  . loratadine (CLARITIN) 10 MG tablet Take 10 mg by mouth daily.  . Multiple Vitamins-Minerals (MULTIVITAMIN WITH MINERALS) tablet Take 1 tablet by mouth daily.    . [DISCONTINUED] ibuprofen (ADVIL,MOTRIN) 800 MG tablet Take 800 mg by mouth every 8 (eight) hours as needed for moderate pain. (Patient not taking: Reported on 03/26/2020)  . [DISCONTINUED] sulfamethoxazole-trimethoprim (BACTRIM DS) 800-160 MG tablet Take 1 tablet by mouth 2 (two) times daily.   Facility-Administered Encounter Medications as of 05/23/2020  Medication  .  0.9 %  sodium chloride infusion   No Known Allergies  ROS: no fever, chills, URI symptoms, chest pain, shortness of breath, GI complaints, other skin concerns, bleeding (other than from the finger, per HPI), bruising, rash.  PHYSICAL EXAM:  BP 136/72   Pulse 72   Ht 5' 4.5" (1.638 m)   Wt 158 lb 12.8 oz (72 kg)   BMI 26.84 kg/m   Well-appearing, pleasant female in no distress  Radial aspect of R index finger-- 0.4-5 cm area of hyperkeratosis, with some surrounding soft tissue swelling.  There is no erythema, warmth, or significant surrounding hyperpigmentation.  It is tender to palpation. No fluctuance.  Verbal consent for treatment. The lesion was pared down with 11 blade.  There was some constant oozing from the central area, relieved by pressure. She was treated x 3 applications of Verrucca-freeze, and tolerated without complications.   ASSESSMENT/PLAN:  Verruca vulgaris - treated with Verrucca-freeze.  Wound care reviewed.  f/u in 3-4 weeks for re-treatment if not completely resolved. 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

## 2020-05-23 ENCOUNTER — Ambulatory Visit: Payer: No Typology Code available for payment source | Admitting: Family Medicine

## 2020-05-23 ENCOUNTER — Encounter: Payer: Self-pay | Admitting: Family Medicine

## 2020-05-23 ENCOUNTER — Other Ambulatory Visit: Payer: Self-pay

## 2020-05-23 VITALS — BP 136/72 | HR 72 | Ht 64.5 in | Wt 158.8 lb

## 2020-05-23 DIAGNOSIS — B079 Viral wart, unspecified: Secondary | ICD-10-CM

## 2020-05-23 DIAGNOSIS — Z23 Encounter for immunization: Secondary | ICD-10-CM | POA: Diagnosis not present

## 2020-05-23 NOTE — Patient Instructions (Signed)
Warts    Warts are small growths on the skin. They are common, and they are caused by a virus. Warts can be found on many parts of the body. A person may have one wart or many warts. Most warts will go away on their own with time, but this could take many months to a few years. Treatments may be done if needed.  What are the causes?  Warts are caused by a type of virus that is called HPV.  · This virus can spread from person to person through touching.  · Warts can also spread to other parts of the body when a person scratches a wart and then scratches normal skin.  What increases the risk?  You are more likely to get warts if:  · You are 10-20 years old.  · You have a weak body defense system (immune system).  · You are Caucasian.  What are the signs or symptoms?  The main symptom of this condition is small growths on the skin. Warts may:  · Be round, oval, or have an uneven shape.  · Feel rough to the touch.  · Be the color of your skin or light yellow, brown, or gray.  · Often be less than ½ inch (1.3 cm) in size.  · Go away and then come back again.  Most warts do not hurt, but some can hurt if they are large or if they are on the bottom of your feet.  How is this diagnosed?  A wart can often be diagnosed by how it looks. In some cases, the doctor might remove a little bit of the wart to test it (biopsy).  How is this treated?  Most of the time, warts do not need treatment. Sometimes people want warts removed. If treatment is needed or wanted, options may include:  · Putting creams or patches with medicine in them on the wart.  · Putting duct tape over the top of the wart.  · Freezing the wart.  · Burning the wart with:  ? A laser.  ? An electric probe.  · Giving a shot of medicine into the wart to help the body's defense system fight off the wart.  · Surgery to remove the wart.  Follow these instructions at home:    Medicines  · Apply over-the-counter and prescription medicines only as told by your  doctor.  · Do not apply over-the-counter wart medicines to your face or genitals before you ask your doctor if it is okay to do that.  Lifestyle  · Keep your body's defense system healthy. To do this:  ? Eat a healthy diet.  ? Get enough sleep.  ? Do not use any products that contain nicotine or tobacco, such as cigarettes and e-cigarettes. If you need help quitting, ask your doctor.  General instructions  · Wash your hands after you touch a wart.  · Do not scratch or pick at a wart.  · Avoid shaving hair that is over a wart.  · Keep all follow-up visits as told by your doctor. This is important.  Contact a doctor if:  · Your warts do not get better after treatment.  · You have redness, swelling, or pain at the site of a wart.  · You have bleeding from a wart, and the bleeding does not stop when you put light pressure on the wart.  · You have diabetes and you get a wart.  Summary  · Warts are small   your hands after you touch a wart.  Keep all follow-up visits as told by your doctor. This is important. This information is not intended to replace advice given to you by your health care provider. Make sure you discuss any questions you have with your health care provider. Document Revised: 10/27/2017 Document Reviewed: 10/27/2017 Elsevier Patient Education  [place].

## 2020-06-05 ENCOUNTER — Encounter: Payer: Self-pay | Admitting: Family Medicine

## 2020-06-06 ENCOUNTER — Encounter: Payer: Self-pay | Admitting: Family Medicine

## 2020-06-06 ENCOUNTER — Other Ambulatory Visit: Payer: Self-pay

## 2020-06-06 ENCOUNTER — Ambulatory Visit: Payer: No Typology Code available for payment source | Admitting: Family Medicine

## 2020-06-06 VITALS — BP 110/70 | HR 84 | Ht 64.5 in | Wt 159.8 lb

## 2020-06-06 DIAGNOSIS — B079 Viral wart, unspecified: Secondary | ICD-10-CM

## 2020-06-06 NOTE — Progress Notes (Signed)
Chief Complaint  Patient presents with  . Finger lesion    Here to have lesion on finger frozen again. Wanted to know if you could numb area prior to freezing-I told her I wasn't sure. I grabbed the topical anesthetic in case.    Patient presents for re-treatment of wart on R index finger.  She reports it blistered, scabbed and healed.  She continues to have pain in the finger (similar to the pain prior to any treatment), and desires further treatment of the wart  She stopped using ibuprofen and instead is using tylenol for pain (had a lot of oozing/bleeding upon paring of the wart at last visit).  PMH, [place] SH reviewed  Outpatient Encounter Medications as of 06/06/2020  Medication Sig  . acetaminophen (TYLENOL) 500 MG tablet Take 1,000 mg by mouth every 6 (six) hours as needed.  . Biotin 1000 MCG tablet Take 1,000 mcg by mouth daily.   . fluticasone (FLONASE) 50 MCG/ACT nasal spray Place 2 sprays into both nostrils daily.  Marland Kitchen loratadine (CLARITIN) 10 MG tablet Take 10 mg by mouth daily.  . Multiple Vitamins-Minerals (MULTIVITAMIN WITH MINERALS) tablet Take 1 tablet by mouth daily.  . [DISCONTINUED] acetaminophen (TYLENOL) 325 MG tablet Take 650 mg by mouth every 6 (six) hours as needed.  Marland Kitchen ibuprofen (ADVIL) 200 MG tablet Take 200 mg by mouth every 6 (six) hours as needed. (Patient not taking: Reported on 06/06/2020)   Facility-Administered Encounter Medications as of 06/06/2020  Medication  . 0.9 %  sodium chloride infusion   No Known Allergies  ROS: no fever, chills, URI symptoms, bleeding, bruising, rashes or other concerns   PHYSICAL EXAM:  BP 110/70   Pulse 84   Ht 5' 4.5" (1.638 m)   Wt 159 lb 12.8 oz (72.5 kg)   BMI 27.01 kg/m   Well-dressed, well-appearing female, in no distress  Radial aspect of R index finger-- 0.5 cm area of hyperkeratosis, with some surrounding soft tissue swelling--this surrounding area is much smaller than at last visit.  The surrounding soft  tissue is not tender, no erythema or warmth.  The central hyperkeratotic area is tender to palpation.  Brisk capillary refill.  Verbal consent obtained for procedure. The wart was pared down significantly with 15 blade.  There was no bleeding. Base was then treated with Verrucca-freeze in freeze-thaw-freeze method x 3 applications in total. Patient tolerated the procedure well.  ASSESSMENT/PLAN:  Verruca vulgaris   Proper woundcare was reviewed. F/u for re-treatment, if needed in 2-4 weeks. If very small, can consider using OTC wart treatments if more convenient.

## 2020-06-21 ENCOUNTER — Encounter: Payer: Self-pay | Admitting: Family Medicine

## 2020-09-18 NOTE — Progress Notes (Signed)
Chief Complaint  Patient presents with  . Annual Exam    Fasting annual with pelvic. Horrible cough X6 months. Knee pain both knees.Want to know if there's a medication for weight loss. See eye dr. Michele Mcalpine.    [name] is a 55 y.o. female who presents for a complete physical.    She has had some ongoing issues with cough.  She was seen for this in October, treated with tessalon.  Allergies felt to be contributing, to use flonase, claritin and mucinex Has had persistent cough since that time. Over the last 2 weeks it has gotten a little better. Coughs everywhere, needs to drink water. Sometimes has to clear her throat. Now is just a cough, not having allergy symptoms yet. Still taking claritin daily.  Using Flonase "as needed"--if she wakes up stuffy. Used to be worse at night, not worse after meals. Denies any reflux symptoms.  She has bilateral knee pain, R>L--feel "tight" more than painful, stiff. She is doing a boot camp, with lots of lunges.  They recommended taping or knee sleeve. No locking, giving way, swelling  We treated wart at her R index finger (twice in December), and then she used Compound W after, and wart has entirely resolved.  She has history of iron deficiency anemia.  She is s/p ablation and had resolution of anemia (normal CBC and ferritin 08/2017).  She no longer has any vaginal bleeding. Has some night sweats, tolerable.   She previously had issues with carpal tunnel syndrome. 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  She uses the braces at night only prn. Denies numbness, tingling, weakness or pain.  Immunization History  Administered Date(s) Administered  . Influenza Inj Mdck Quad Pf 03/26/2019  . Influenza Whole 03/23/2010  . Influenza,inj,Quad PF,6+ Mos 02/14/2013, 04/08/2017, 05/02/2020  . Influenza-Unspecified 04/13/2015, 03/23/2018  . PFIZER(Purple  Top)SARS-COV-2 Vaccination 09/04/2019, 09/24/2019, 05/23/2020  . Tdap 02/10/2011   Last Pap smear: 08/2017, normal, no high risk HPV detected Last mammogram:02/2020 Last colonoscopy: 09/2017 Dr. Almyra Free. Repeat 5 years (due to FHx) Last DEXA: heel screen normal in 06/2006 at [facility] Dentist: yearly for cleaning (has dentures on top) Ophtho: yearly Exercise: [place] (40/20-HIIT x 26 minutes) 5x/week, sometimes is also at the gym for cardio 30-45 minutes.  Some weights with [employer] (and body weight).  Vitamin D level was 51 in 08/2017 Lipid: Lab Results  Component Value Date   CHOL 149 09/19/2019   HDL 70 09/19/2019   LDLCALC 72 09/19/2019   TRIG 23 09/19/2019   CHOLHDL 2.1 09/19/2019   PMH, PSH, SH and FH reviewed and updated  Outpatient Encounter Medications as of 09/19/2020  Medication Sig  . loratadine (CLARITIN) 10 MG tablet Take 10 mg by mouth daily.  . Multiple Vitamins-Minerals (MULTIVITAMIN WITH MINERALS) tablet Take 1 tablet by mouth daily.  Marland Kitchen acetaminophen (TYLENOL) 500 MG tablet Take 1,000 mg by mouth every 6 (six) hours as needed. (Patient not taking: Reported on 09/19/2020)  . Biotin 1000 MCG tablet Take 1,000 mcg by mouth daily.  (Patient not taking: Reported on 09/19/2020)  . fluticasone (FLONASE) 50 MCG/ACT nasal spray Place 2 sprays into both nostrils daily. (Patient not taking: Reported on 09/19/2020)  . ibuprofen (ADVIL) 200 MG tablet Take 200 mg by mouth every 6 (six) hours as needed. (Patient not taking: No sig reported)   Facility-Administered Encounter Medications as of 09/19/2020  Medication  . 0.9 %  sodium chloride infusion   No Known Allergies   ROS: The patient denies anorexia, fever, weight changes, headaches, vision changes, decreased hearing, ear pain, sore throat, chest pain, palpitations, dizziness, syncope, dyspnea on exertion, cough, swelling, nausea, vomiting, diarrhea, constipation, abdominal pain, melena, hematochezia,  indigestion/heartburn, hematuria, dysuria, vaginal bleeding (s/p ablation),discharge, odor or itch, genital lesions, weakness, tremor, suspicious skin lesions, depression, anxiety, abnormal bleeding/bruising, or enlarged lymph nodes.  Hot flashes/night sweats are mild/tolerable Wart L index finger resolved. Occ numbness/tingling (CTS) L hand, uses braces prn. Cough, per HPI. Knee pain/stiffness per HPI.   PHYSICAL EXAM:  BP 112/80   Pulse 84   Temp (!) 97.2 F (36.2 C) (Tympanic)   Ht 5\' 5"  (1.651 m)   Wt 156 lb 6.4 oz (70.9 kg)   BMI 26.03 kg/m   Wt Readings from Last 3 Encounters:  09/19/20 156 lb 6.4 oz (70.9 kg)  06/06/20 159 lb 12.8 oz (72.5 kg)  05/23/20 158 lb 12.8 oz (72 kg)     General Appearance:  Alert, cooperative, no distress, appears stated age   Head:  Normocephalic, without obvious abnormality, atraumatic   Eyes:  PERRL, conjunctiva/corneas clear, EOM's intact, fundi benign. Mildly injected conjunctiva medially on L  Ears:  Normal TM's and external ear canals   Nose:  Nasal mucosa is mild-moderately edematous, no mucus or erythema noted. Sinuses nontender  Throat:  Normal, no lesions, no cobblestoning.  Neck:  Supple, no lymphadenopathy; thyroid: no enlargement/tenderness/ nodules; no carotid bruit or JVD   Back:  Spine nontender, no curvature, ROM normal, no CVA tenderness   Lungs:  Clear to auscultation bilaterally without wheezes, rales or ronchi; respirations unlabored   Chest Wall:  No tenderness or deformity   Heart:  Regular rate and rhythm, S1 and S2 normal, no murmur, rub or gallop   Breast Exam:  No nipple discharge, inversion, skin dimpling, breast masses or axillary lymphadenopathy. Nontender. WHSS right breast  Abdomen:  Soft, non-tender, nondistended, normoactive bowel sounds, no masses, no hepatosplenomegaly.   Genitalia:  Normal external genitalia. No lesions, vaginal discharge, cervical motion tenderness, uterine or  adnexal tenderness or masses. Pap not performed  Rectal: Normal sphincter tone, no masses, heme negative stool  Extremities:  No clubbing, cyanosis or edema. She has some swelling noted inferiorly to patella. No erythema, minimally tender.  No joint effusion. Hypopigmentation at distal phalynx of L index finger.  No residual verucca  Pulses:  2+ and symmetric all extremities   Skin:  Skin color, texture, turgor normal, no rashes or lesions   Lymph nodes:  Cervical, supraclavicular, and axillary nodes normal   Neurologic:   Normal strength, sensation and gait; reflexes 2+ and symmetric throughout    Psych:  Normal mood, affect, hygiene and grooming   ASSESSMENT/PLAN:  Annual physical exam - Plan: POCT Urinalysis DIP (Proadvantage Device)  Need for Tdap vaccination - Plan: Tdap vaccine greater than or equal to 7yo IM  Allergic rhinitis, unspecified seasonality, unspecified trigger - reviewed proper way to use Flonase. Cont antihistamines.    Cough - suspect PND from allergies. To use Flonase and antihistamine daily, Mucinex and sinus rinses prn  Acute pain of both knees - suspect overuse--component of tendonitis vs bursitis.  Discussed rest, ice, NSAID x 1-2 wks. Proper form for squats/lunges reviewed  She had concerns regarding her weight.  We discussed weight, healthy diet and exercise in detail.  Discussed monthly self breast exams and yearly mammograms; at least 30  minutes of aerobic activity at least 5 days/week, weight-bearing exercise 2x/wk; proper sunscreen use reviewed; healthy diet, including goals of calcium and vitamin D intake and alcohol recommendations (less than or equal to 1 drink/day) reviewed; regular seatbelt use; changing batteries in smoke detectors. Immunization recommendations discussed. TdaP given today; continueyearly flu shots and Shingrixrecommended (risks/SE reviewed; check insurance and schedule NV at least 2 weeks from  today's vaccine). Colonoscopy UTD, next due 09/2022 Pap next due 08/2022  No labs done today.  To check HepC/HIV in future when labs rechecked.  F/u 1 year, sooner prn.

## 2020-09-18 NOTE — Patient Instructions (Addendum)
HEALTH MAINTENANCE RECOMMENDATIONS:  It is recommended that you get at least 30 minutes of aerobic exercise at least 5 days/week (for weight loss, you may need as much as 60-90 minutes). This can be any activity that gets your heart rate up. This can be divided in 10-15 minute intervals if needed, but try and build up your endurance at least once a week.  Weight bearing exercise is also recommended twice weekly.  Eat a healthy diet with lots of vegetables, fruits and fiber.  "Colorful" foods have a lot of vitamins (ie green vegetables, tomatoes, red peppers, etc).  Limit sweet tea, regular sodas and alcoholic beverages, all of which has a lot of calories and sugar.  Up to 1 alcoholic drink daily may be beneficial for women (unless trying to lose weight, watch sugars).  Drink a lot of water.  Calcium recommendations are 1200-1500 mg daily (1500 mg for postmenopausal women or women without ovaries), and vitamin D 1000 IU daily.  This should be obtained from diet and/or supplements (vitamins), and calcium should not be taken all at once, but in divided doses.  Monthly self breast exams and yearly mammograms for women over the age of 81 is recommended.  Sunscreen of at least SPF 30 should be used on all sun-exposed parts of the skin when outside between the hours of 10 am and 4 pm (not just when at beach or pool, but even with exercise, golf, tennis, and yard work!)  Use a sunscreen that says "broad spectrum" so it covers both UVA and UVB rays, and make sure to reapply every 1-2 hours.  Remember to change the batteries in your smoke detectors when changing your clock times in the spring and fall. Carbon monoxide detectors are recommended for your home.  Use your seat belt every time you are in a car, and please drive safely and not be distracted with cell phones and texting while driving.  I recommend getting the new shingles vaccine (Shingrix). Check with your insurance to verify what your out of  pocket cost may be (usually covered as preventative, but better to verify to avoid any surprises, as this vaccine is expensive), and then schedule a nurse visit at our office when convenient (based on the possible side effects as discussed).   This is a series of 2 injections, spaced 2 months apart.  It doesn't have to be exactly 2 months apart (but can't be under 2 months), if that isn't feasible for your schedule, but try and get them close to 2 months (and definitely within 6 months of each other, or else the efficacy of the vaccine drops off).  Flonase needs to be used daily to see the full effect (can take a week to see this).  Use it regularly throughout the season where allergies flare. Start at 2 sprays into each nostril, and if allergies are well controlled, some people can reduce it to 1 spray for maintenance. Use Mucinex as needed for any thick mucus/drainage or cough.  We briefly discussed that reflux can contribute to chronic cough.  You could do a 2 week trial of prilosec OTC, Nexium (these are just once daily, prior to breakfast)  or Pepcid (twice daily).  You might have a mild bursitis vs tendonitis at the knees. Try taking Aleve (1-2 pills) twice daily with food for 7-10 days. Ice your knees after boot camp. Be sure not to extend the knee over the foot/ankle during your squats and lunges.   MyPlate from [employer]  MyPlate is an outline of a general healthy diet based on the 2010 Dietary Guidelines for Americans, from the U.S. Department of Agriculture Scientist, research (physical sciences)). It sets guidelines for how much food you should eat from each food group based on your age, sex, and level of physical activity. What are tips for following MyPlate? To follow MyPlate recommendations:  Eat a wide variety of fruits and vegetables, grains, and protein foods.  Serve smaller portions and eat less food throughout the day.  Limit portion sizes to avoid overeating.  Enjoy your food.  Get at least 150 minutes of  exercise every week. This is about 30 minutes each day, 5 or more days per week. It can be difficult to have every meal look like MyPlate. Think about MyPlate as eating guidelines for an entire day, rather than each individual meal. Fruits and vegetables  Make half of your plate fruits and vegetables.  Eat many different colors of fruits and vegetables each day.  For a 2,000 calorie daily food plan, eat: ? 2 cups of vegetables every day. ? 2 cups of fruit every day.  1 cup is equal to: ? 1 cup raw or cooked vegetables. ? 1 cup raw fruit. ? 1 medium-sized orange, apple, or banana. ? 1 cup 100% fruit or vegetable juice. ? 2 cups raw leafy greens, such as lettuce, spinach, or kale. ?  cup dried fruit. Grains  One fourth of your plate should be grains.  Make at least half of the grains you eat each day whole grains.  For a 2,000 calorie daily food plan, eat 6 oz of grains every day.  1 oz is equal to: ? 1 slice bread. ? 1 cup cereal. ?  cup cooked rice, cereal, or pasta. Protein  One fourth of your plate should be protein.  Eat a wide variety of protein foods, including meat, poultry, fish, eggs, beans, nuts, and tofu.  For a 2,000 calorie daily food plan, eat 5 oz of protein every day.  1 oz is equal to: ? 1 oz meat, poultry, or fish. ?  cup cooked beans. ? 1 egg. ?  oz nuts or seeds. ? 1 Tbsp peanut butter. Dairy  Drink fat-free or low-fat (1%) milk.  Eat or drink dairy as a side to meals.  For a 2,000 calorie daily food plan, eat or drink 3 cups of dairy every day.  1 cup is equal to: ? 1 cup milk, yogurt, cottage cheese, or soy milk (soy beverage). ? 2 oz processed cheese. ? 1 oz natural cheese. Fats, oils, salt, and sugars  Only small amounts of oils are recommended.  Avoid foods that are high in calories and low in nutritional value (empty calories), like foods high in fat or added sugars.  Choose foods that are low in salt (sodium). Choose  foods that have less than 140 milligrams (mg) of sodium per serving.  Drink water instead of sugary drinks. Drink enough water each day to keep your urine pale yellow. Where to find support  Work with your health care provider or a nutrition specialist (dietitian) to develop a customized eating plan that is right for you.  Download an app (mobile application) to help you track your daily food intake. Where to find more information  Go to [email or web address].hu for more information. Summary  MyPlate is a general guideline for healthy eating from the USDA. It is based on the 2010 Dietary Guidelines for Americans.  In general, fruits and vegetables should take up  of your plate, grains should take up  of your plate, and protein should take up  of your plate. This information is not intended to replace advice given to you by your health care provider. Make sure you discuss any questions you have with your health care provider. Document Revised: 11/11/2018 Document Reviewed: 09/08/2016 Elsevier Patient Education  [place].

## 2020-09-19 ENCOUNTER — Encounter: Payer: Self-pay | Admitting: Family Medicine

## 2020-09-19 ENCOUNTER — Other Ambulatory Visit: Payer: Self-pay

## 2020-09-19 ENCOUNTER — Ambulatory Visit (INDEPENDENT_AMBULATORY_CARE_PROVIDER_SITE_OTHER): Payer: 59 | Admitting: Family Medicine

## 2020-09-19 VITALS — BP 112/80 | HR 84 | Temp 97.2°F | Ht 65.0 in | Wt 156.4 lb

## 2020-09-19 DIAGNOSIS — Z23 Encounter for immunization: Secondary | ICD-10-CM | POA: Diagnosis not present

## 2020-09-19 DIAGNOSIS — M25561 Pain in right knee: Secondary | ICD-10-CM | POA: Diagnosis not present

## 2020-09-19 DIAGNOSIS — M25562 Pain in left knee: Secondary | ICD-10-CM

## 2020-09-19 DIAGNOSIS — Z0000 Encounter for general adult medical examination without abnormal findings: Secondary | ICD-10-CM

## 2020-09-19 DIAGNOSIS — R059 Cough, unspecified: Secondary | ICD-10-CM

## 2020-09-19 DIAGNOSIS — J309 Allergic rhinitis, unspecified: Secondary | ICD-10-CM | POA: Diagnosis not present

## 2020-09-19 LAB — POCT URINALYSIS DIP (PROADVANTAGE DEVICE)
Bilirubin, UA: NEGATIVE
Blood, UA: NEGATIVE
Glucose, UA: NEGATIVE mg/dL
Nitrite, UA: NEGATIVE
Protein Ur, POC: NEGATIVE mg/dL
Specific Gravity, Urine: 1.03
Urobilinogen, Ur: NEGATIVE
pH, UA: 6 (ref 5.0–8.0)

## 2020-10-11 ENCOUNTER — Encounter: Payer: Self-pay | Admitting: Family Medicine

## 2020-10-11 DIAGNOSIS — M25561 Pain in right knee: Secondary | ICD-10-CM

## 2020-10-15 NOTE — Progress Notes (Signed)
Subjective:    I'm seeing this patient as a consultation for:  Dr. Tomi Bamberger. Note will be routed back to referring provider/PCP.  CC: B knee pain  I, Molly Weber, LAT, ATC, am serving as scribe for Dr. Lynne Leader.  HPI: Pt is a 55 y/o female presenting w/ c/o B knee tightness and stiffness x 4-5 months.  She locates her pain to her B ant/post knees, ant >post.  She enjoys walking, doing the elliptical and HIIT classes.  Knee swelling: yes Knee mechanical symptoms: yes Aggravating factors: after working out; climbing stairs; prolonged walking Treatments tried: Aleve, IBU, ice  Past medical history, Surgical history, Family history, Social history, Allergies, and medications have been entered into the medical record, reviewed.   Review of Systems: No new headache, visual changes, nausea, vomiting, diarrhea, constipation, dizziness, abdominal pain, skin rash, fevers, chills, night sweats, weight loss, swollen lymph nodes, body aches, joint swelling, muscle aches, chest pain, shortness of breath, mood changes, visual or auditory hallucinations.   Objective:    Vitals:   10/16/20 1010  BP: 102/74  Pulse: 85  SpO2: 99%   General: Well Developed, well nourished, and in no acute distress.  Neuro/Psych: Alert and oriented x3, extra-ocular muscles intact, able to move all 4 extremities, sensation grossly intact. Skin: Warm and dry, no rashes noted.  Respiratory: Not using accessory muscles, speaking in full sentences, trachea midline.  Cardiovascular: Pulses palpable, no extremity edema. Abdomen: Does not appear distended. MSK:  Right knee moderate effusion normal-appearing otherwise. Normal motion with crepitation. Nontender. Slight laxity to anterior drawer testing.  Otherwise ligamentous exam is stable Positive medial McMurray's test. Intact strength.  Knee moderate effusion normal-appearing otherwise. Normal motion with crepitation. Nontender. No laxity.  Stable ligamentous  exam. Negative McMurray's test. Intact strength.   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  No fractures.  Left knee: Mild to moderate medial compartment DJD.  No fractures.  Await formal radiology review  Diagnostic Limited MSK Ultrasound of: Right knee Quad tendon intact normal-appearing Moderate joint effusion. Patellar tendon intact. Lateral joint line mild degenerative lateral meniscus. Medial joint line narrowed with visible tear through the medial meniscus.  Posterior knee tiny Baker's cyst Impression: Medial DJD and medial meniscus tear.  Moderate joint effusion . Diagnostic Limited MSK Ultrasound of: Left knee Quad tendon intact normal-appearing Moderate joint effusion. Patellar tendon intact. Medial joint line degenerative appearing medial meniscus. Narrowed medial compartment Lateral joint line normal-appearing Posterior knee small Baker's cyst. Impression: Medial DJD.  Small Baker's cyst.  Moderate joint effusion.   Impression and Recommendations:    Assessment and Plan: 55 y.o. female with bilateral knee effusion.  Patient feels that her knees are stiff and swollen but does not have significant pain or mechanical symptoms.  I think she is feeling her knee effusions.  The effusions are driven by the DJD and probably some meniscus tears.  Discussed options.  Plan for conservative management strategies including Voltaren gel compression sleeve and oral NSAIDs.  Additionally trial of VMO quad strengthening exercises taught in clinic today by ATC.  Recheck back in about a month.  If not improved plan for steroid injection.  PDMP not reviewed this encounter. Orders Placed This Encounter  Procedures  . [place] LIMITED JOINT SPACE STRUCTURES LOW BILAT(NO LINKED CHARGES)    Order Specific Question:   Reason for Exam (SYMPTOM  OR DIAGNOSIS REQUIRED)  Answer:   B knee pain    Order Specific Question:   Preferred imaging location?    Answer:   [place]  . DG Knee AP/LAT W/Sun[place] Left    Standing Status:   Future    Number of Occurrences:   1    Standing Expiration Date:   11/15/2020    Order Specific Question:   Reason for Exam (SYMPTOM  OR DIAGNOSIS REQUIRED)    Answer:   L knee pain    Order Specific Question:   Is patient pregnant?    Answer:   No    Order Specific Question:   Preferred imaging location?    Answer:   [facility]  . DG Knee AP/LAT W/Sunrise Right    Standing Status:   Future    Number of Occurrences:   1    Standing Expiration Date:   11/15/2020    Order Specific Question:   Reason for Exam (SYMPTOM  OR DIAGNOSIS REQUIRED)    Answer:   R knee pain    Order Specific Question:   Is patient pregnant?    Answer:   No    Order Specific Question:   Preferred imaging location?    Answer:   [facility]   No orders of the defined types were placed in this encounter.   Discussed warning signs or symptoms. Please see discharge instructions. Patient expresses understanding.   The above documentation has been reviewed and is accurate and complete Lynne Leader, M.D.

## 2020-10-16 ENCOUNTER — Ambulatory Visit (INDEPENDENT_AMBULATORY_CARE_PROVIDER_SITE_OTHER): Payer: 59 | Admitting: Family Medicine

## 2020-10-16 ENCOUNTER — Ambulatory Visit (INDEPENDENT_AMBULATORY_CARE_PROVIDER_SITE_OTHER): Payer: 59

## 2020-10-16 ENCOUNTER — Ambulatory Visit: Payer: Self-pay

## 2020-10-16 ENCOUNTER — Other Ambulatory Visit: Payer: Self-pay

## 2020-10-16 ENCOUNTER — Encounter: Payer: Self-pay | Admitting: Family Medicine

## 2020-10-16 VITALS — BP 102/74 | HR 85 | Ht 65.0 in | Wt 160.4 lb

## 2020-10-16 DIAGNOSIS — G8929 Other chronic pain: Secondary | ICD-10-CM | POA: Diagnosis not present

## 2020-10-16 DIAGNOSIS — M25562 Pain in left knee: Secondary | ICD-10-CM

## 2020-10-16 DIAGNOSIS — M25561 Pain in right knee: Secondary | ICD-10-CM

## 2020-10-16 NOTE — Patient Instructions (Signed)
Nice to meet you.  Please get an Xray today before you leave  Please perform the exercise program that we have prepared for you and gone over in detail on a daily basis.  In addition to the handout you were provided you can access your program through: [email or web address]   Your unique program code is: [id number]  I recommend you obtained a compression sleeve to help with your joint problems. There are many options on the market however I recommend obtaining a knee Body Helix compression sleeve.  You can find information (including how to appropriate measure yourself for sizing) can be found at www.Body [email or web address]://www.lambert.[email or web address]/.  Many of these products are health savings account (HSA) eligible.   You can use the compression sleeve at any time throughout the day but is most important to use while being active as well as for 2 hours post-activity.   It is appropriate to ice following activity with the compression sleeve in place.  Please use Voltaren gel (Generic Diclofenac Gel) up to 4x daily for pain as needed.  This is available over-the-counter as both the name brand Voltaren gel and the generic diclofenac gel.  Please schedule a one month follow-up.

## 2020-10-17 NOTE — Progress Notes (Signed)
X-ray right knee shows arthritis in the knee joint.  No fractures visible.

## 2020-10-17 NOTE — Progress Notes (Signed)
X-ray left knee shows medium arthritis.

## 2020-10-30 ENCOUNTER — Telehealth: Payer: Self-pay | Admitting: Family Medicine

## 2020-10-30 NOTE — Telephone Encounter (Signed)
Dismissal letter in guarantor snapshot  °

## 2020-11-13 NOTE — Progress Notes (Deleted)
   I, Wendy Poet, LAT, ATC, am serving as scribe for Dr. Lynne Leader.  Jewelia Bocchino is a 55 y.o. female who presents to [place] at [facility] today for f/u of B knee stiffness.  She was last seen by Dr. Georgina Snell on 10/16/20 and was shown a HEP focusing on quad strengthening.  She was also advised to use knee compression sleeves and Voltaren gel.  Pt is very active and enjoys walking, doing the elliptical and HIIT classes. Since her last visit, pt reports   Diagnostic imaging: R and L knee XR- 10/16/20  Pertinent review of systems: ***  Relevant historical information: ***   Exam:  There were no vitals taken for this visit. General: Well Developed, well nourished, and in no acute distress.   MSK: ***    Lab and Radiology Results No results found for this or any previous visit (from the past 72 hour(s)). No results found.     Assessment and Plan: 55 y.o. female with ***   PDMP not reviewed this encounter. No orders of the defined types were placed in this encounter.  No orders of the defined types were placed in this encounter.    Discussed warning signs or symptoms. Please see discharge instructions. Patient expresses understanding.   ***

## 2020-11-14 ENCOUNTER — Ambulatory Visit: Payer: 59 | Admitting: Family Medicine

## 2021-02-05 ENCOUNTER — Other Ambulatory Visit: Payer: Self-pay | Admitting: Family Medicine

## 2021-02-05 DIAGNOSIS — Z1231 Encounter for screening mammogram for malignant neoplasm of breast: Secondary | ICD-10-CM

## 2021-03-26 ENCOUNTER — Other Ambulatory Visit: Payer: Self-pay

## 2021-03-26 ENCOUNTER — Ambulatory Visit
Admission: RE | Admit: 2021-03-26 | Discharge: 2021-03-26 | Disposition: A | Discharge status: Home / Self Care | Payer: 59 | Source: Ambulatory Visit | Attending: Family Medicine | Admitting: Family Medicine

## 2021-03-26 DIAGNOSIS — Z1231 Encounter for screening mammogram for malignant neoplasm of breast: Secondary | ICD-10-CM

## 2021-08-13 ENCOUNTER — Ambulatory Visit (INDEPENDENT_AMBULATORY_CARE_PROVIDER_SITE_OTHER): Payer: 59 | Admitting: Family Medicine

## 2021-08-13 ENCOUNTER — Encounter: Payer: Self-pay | Admitting: Family Medicine

## 2021-08-13 VITALS — BP 130/80 | HR 83 | Ht 65.0 in | Wt 139.2 lb

## 2021-08-13 DIAGNOSIS — R053 Chronic cough: Secondary | ICD-10-CM | POA: Diagnosis not present

## 2021-08-13 MED ORDER — CHLORPHENIRAMINE MALEATE 4 MG PO TABS: 4.0000 mg | ORAL_TABLET | Freq: Every evening | ORAL | 0 refills | Status: AC | PRN

## 2021-08-13 MED ORDER — BENZONATATE 200 MG PO CAPS: 200.0000 mg | ORAL_CAPSULE | Freq: Two times a day (BID) | ORAL | 1 refills | Status: DC | PRN

## 2021-08-13 MED ORDER — ALBUTEROL SULFATE HFA 108 (90 BASE) MCG/ACT IN AERS
2.0000 | INHALATION_SPRAY | Freq: Four times a day (QID) | RESPIRATORY_TRACT | 11 refills | Status: DC | PRN
Start: 2021-08-13 — End: 2024-01-20

## 2021-08-13 NOTE — Patient Instructions (Addendum)
For your cough: Start taking the chlorpheniramine at night to help dry up the drainage Benzonatate twice a day to stop the cough  Albuterol as needed for shortness of breath/wheezing. Try using before exercise to prevent symptoms.  Delsym as needed to settle the cough Sip on liquids, drink warm liquids with honey Voice rest, avoid throat clearing Follow-up with allergy/asthma doctor for spirometry  Let us know if you develop any new or worsening symptoms    Thank you for choosing [facility] at [facility] for your Primary Care needs. I am excited for the opportunity to partner with you to meet your health care goals. It was a pleasure meeting you today!   Information on diet, exercise, and health maintenance recommendations are listed below. This is information to help you be sure you are on track for optimal health and monitoring.   Please look over this and let us know if you have any questions or if you have completed any of the health maintenance outside of [place] so that we can be sure your records are up to date.  ___________________________________________________________  MyChart:  For all urgent or time sensitive needs we ask that you please call the office to avoid delays. Our number is [phone]. MyChart is not constantly monitored and due to the large volume of messages a day, replies may take up to 72 business hours.  MyChart Policy: MyChart allows for you to see your visit notes, after visit summary, provider recommendations, lab and tests results, make an appointment, request refills, and contact your provider or the office for non-urgent questions or concerns. Providers are seeing patients during normal business hours and do not have built in time to review MyChart messages.  We ask that you allow a minimum of 3 business days for responses to [employer]. For this reason, please do not send urgent requests through [place]. Please call the  office at [phone]. New and ongoing conditions may require a visit. We have virtual and in-person visits available for your convenience.  Complex MyChart concerns may require a visit. Your provider may request you schedule a virtual or in-person visit to ensure we are providing the best care possible. MyChart messages sent after 11:00 AM on Friday will not be received by the provider until Monday morning.    Lab and Test Results: You will receive your lab and test results on MyChart as soon as they are completed and results have been sent by the lab or testing facility. Due to this service, you will receive your results BEFORE your provider.  I review lab and test results each morning prior to seeing patients. Some results require collaboration with other providers to ensure you are receiving the most appropriate care. For this reason, we ask that you please allow a minimum of 3-5 business days from the time that ALL results have been received for your provider to receive and review lab and test results and contact you about these.  Most lab and test result comments from the provider will be sent through [place]. Your provider may recommend changes to the plan of care, follow-up visits, repeat testing, ask questions, or request an office visit to discuss these results. You may reply directly to this message or call the office to provide information for the provider or set up an appointment. In some instances, you will be called with test results and recommendations. Please let us know if this is preferred and we will make note of  this in your chart to provide this for you.    If you have not heard a response to your lab or test results in 5 business days from all results returning to [place], please call the office to let us know. We ask that you please avoid calling prior to this time unless there is an emergent concern. Due to high call volumes, this can delay the resulting process.  After  Hours: For all non-emergency after hours needs, please call the office at [phone] and select the option to reach the on-call  service. On-call services are shared between multiple [place] offices and therefore it will not be possible to speak directly with your provider. On-call providers may provide medical advice and recommendations, but are unable to provide refills for maintenance medications.  For all emergency or urgent medical needs after normal business hours, we recommend that you seek care at the closest Urgent Care or Emergency Department to ensure appropriate treatment in a timely manner.  [facility] at [place] has a 24 hour emergency room located on the ground floor for your convenience.   Urgent Concerns During the Business Day Providers are seeing patients from 8AM to [place] with a busy schedule and are most often not able to respond to non-urgent calls until the end of the day or the next business day. If you should have URGENT concerns during the day, please call and speak to the nurse or schedule a same day appointment so that we can address your concern without delay.   Thank you, again, for choosing me as your health care partner. I appreciate your trust and look forward to learning more about you.   [facility] [employer] Olevia Bowens, DNP, FNP-C  ___________________________________________________________  Health Maintenance Recommendations Screening Testing Mammogram Every 1-2 years based on history and risk factors Starting at age 84 Pap Smear Ages 21-39 every 3 years Ages 60-65 every 5 years with HPV testing More frequent testing may be required based on results and history Colon Cancer Screening Every 1-10 years based on test performed, risk factors, and history Starting at age 3 Bone Density Screening Every 2-10 years based on history Starting at age 34 for women Recommendations for men differ based on medication usage, history, and risk factors AAA  Screening One time ultrasound Men 76-25 years old who have ever smoked Lung Cancer Screening Low Dose Lung CT every 12 months Age 76-80 years with a 20 pack-year smoking history who still smoke or who have quit within the last 15 years  Screening Labs Routine  Labs: Complete Blood Count (CBC), Complete Metabolic Panel (CMP), Cholesterol (Lipid Panel) Every 6-12 months based on history and medications May be recommended more frequently based on current conditions or previous results Hemoglobin A1c Lab Every 3-12 months based on history and previous results Starting at age 34 or earlier with diagnosis of diabetes, high cholesterol, BMI >26, and/or risk factors Frequent monitoring for patients with diabetes to ensure blood sugar control Thyroid Panel (TSH w/ T3 & T4) Every 6 months based on history, symptoms, and risk factors May be repeated more often if on medication HIV One time testing for all patients 55 and older May be repeated more frequently for patients with increased risk factors or exposure Hepatitis C One time testing for all patients 71 and older May be repeated more frequently for patients with increased risk factors or exposure Gonorrhea, Chlamydia Every 12 months for all sexually active persons 13-24 years Additional monitoring may be recommended for those  who are considered high risk or who have symptoms PSA Men 14-5 years old with risk factors Additional screening may be recommended from age 58-69 based on risk factors, symptoms, and history  Vaccine Recommendations Tetanus Booster All adults every 10 years Flu Vaccine All patients 6 months and older every year COVID Vaccine All patients 12 years and older Initial dosing with booster May recommend additional booster based on age and health history HPV Vaccine 2 doses all patients age 14-26 Dosing may be considered for patients over 26 Shingles Vaccine (Shingrix) 2 doses all adults 71 years and  older Pneumonia (Pneumovax 12) All adults 52 years and older May recommend earlier dosing based on health history Pneumonia (Prevnar 1) All adults 26 years and older Dosed 1 year after Pneumovax 23 Pneumonia (Prevnar 64) All adults 58 years and older (adults 09-73 with certain conditions or risk factors) 1 dose  For those who have no received Prevnar 13 vaccine previously   Additional Screening, Testing, and Vaccinations may be recommended on an individualized basis based on family history, health history, risk factors, and/or exposure.  __________________________________________________________  Diet Recommendations for All Patients  I recommend that all patients maintain a diet low in saturated fats, carbohydrates, and cholesterol. While this can be challenging at first, it is not impossible and small changes can make big differences.  Things to try: Decreasing the amount of soda, sweet tea, and/or juice to one or less per day and replace with water While water is always the first choice, if you do not like water you may consider adding a water additive without sugar to improve the taste other sugar free drinks Replace potatoes with a brightly colored vegetable at dinner Use healthy oils, such as canola oil or olive oil, instead of butter or hard margarine Limit your bread intake to two pieces or less a day Replace regular pasta with low carb pasta options Bake, broil, or grill foods instead of frying Monitor portion sizes  Eat smaller, more frequent meals throughout the day instead of large meals  An important thing to remember is, if you love foods that are not great for your health, you don't have to give them up completely. Instead, allow these foods to be a reward when you have done well. Allowing yourself to still have special treats every once in a while is a nice way to tell yourself thank you for working hard to keep yourself healthy.   Also remember that every day is a  new day. If you have a bad day and "fall off the wagon", you can still climb right back up and keep moving along on your journey!  We have resources available to help you!  Some websites that may be helpful include: www.http://carter.biz/  Www.VeryWellFit.com _____________________________________________________________  Activity Recommendations for All Patients  I recommend that all adults get at least 20 minutes of moderate physical activity that elevates your heart rate at least 5 days out of the week.  Some examples include: Walking or jogging at a pace that allows you to carry on a conversation Cycling (stationary bike or outdoors) Water aerobics Yoga Weight lifting Dancing If physical limitations prevent you from putting stress on your joints, exercise in a pool or seated in a chair are excellent options.  Do determine your MAXIMUM heart rate for activity: YOUR AGE - 220 = MAX HeartRate   Remember! Do not push yourself too hard.  Start slowly and build up your pace, speed, weight, time in exercise, etc.  Allow your body to rest between exercise and get good sleep. You will need more water than normal when you are exerting yourself. Do not wait until you are thirsty to drink. Drink with a purpose of getting in at least 8, 8 ounce glasses of water a day plus more depending on how much you exercise and sweat.    If you begin to develop dizziness, chest pain, abdominal pain, jaw pain, shortness of breath, headache, vision changes, lightheadedness, or other concerning symptoms, stop the activity and allow your body to rest. If your symptoms are severe, seek emergency evaluation immediately. If your symptoms are concerning, but not severe, please let us know so that we can recommend further evaluation.

## 2021-08-13 NOTE — Progress Notes (Signed)
______________________________________________________________________  HPI [name] is a 56 y.o. female presenting to [place] at [facility] today to establish care.   Patient Care Team: Terrilyn Saver, NP as PCP - General (Family Medicine)  Health Maintenance  Topic Date Due   HIV Screening  Never done   Hepatitis C Screening: USPSTF Recommendation to screen - Ages 67-79 yo.  Never done   Zoster (Shingles) Vaccine (1 of 2) Never done   Pap Smear  09/15/2022   Colon Cancer Screening  10/01/2022   Mammogram  03/27/2023   Tetanus Vaccine  09/20/2030   Flu Shot  Completed   COVID-19 Vaccine  Completed   HPV Vaccine  Aged Out     Concerns today: Cough - has tried treating as allergies; off and on for a few years; never a smoker, dry cough, nonproductive, no fevers, body aches, chills; settles down with water, cough drops; has never been tested for allergies, no sneezing; occasional itchy eyes in spring; hasn't noticed any triggers, currently not too bad. Something is always there, cannot go more than a couple days without coughing. So far has tried Claritin, Flonase, cough drops; she does clear her throat and have some postnasal drainage, cough is worse at night when lying flat. No reflux, indigestion, belching. No chest pain, fevers, weight loss, blood in sputum. No history of asthma, but does have some dyspnea with exertion and wheezing at times after working out.      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 ______________________________________________________________________ PMH Past Medical History:  Diagnosis Date   Allergy    Endometrial polyp    Iron deficiency anemia    Wears contact lenses     ROS All review of systems negative except what is listed in the HPI  PHYSICAL EXAM Physical Exam Vitals reviewed.  Constitutional:      Appearance: Normal appearance. She is normal weight.  HENT:     Head: Normocephalic and atraumatic.     Right Ear: Tympanic membrane normal.     Left Ear: Tympanic membrane normal.     Nose: Nose normal.     Mouth/Throat:     Mouth: Mucous membranes are moist.     Pharynx: Oropharynx is clear. No posterior oropharyngeal erythema.  Cardiovascular:     Rate and Rhythm: Normal rate and regular rhythm.     Pulses: Normal pulses.     Heart sounds: Normal heart sounds.  Pulmonary:     Effort: Pulmonary effort is normal. No respiratory distress.     Breath sounds: Normal breath sounds. No stridor. No wheezing, rhonchi or rales.  Chest:     Chest wall: No tenderness.  Musculoskeletal:     Cervical back: Normal range of motion and neck supple.  Skin:    General: Skin is warm and dry.     Capillary Refill: Capillary refill takes less than  2 seconds.  Neurological:     General: No focal deficit present.     Mental Status: She is alert and oriented to person, place, and time. Mental status is at baseline.  Psychiatric:        Mood and Affect: Mood normal.        Behavior: Behavior normal.        Thought Content: Thought content normal.        Judgment: Judgment normal.   ______________________________________________________________________ ASSESSMENT AND PLAN  1. Chronic cough No red flags today. Referring for allergy/asthma testing. For your cough: Start taking the chlorpheniramine at night to help dry up the  drainage Benzonatate twice a day to stop the cough  Albuterol as needed for shortness of breath/wheezing. Try using before exercise to prevent symptoms.  Delsym as needed to settle the cough Sip on liquids, drink warm liquids with honey Voice rest, avoid throat clearing Follow-up with allergy/asthma doctor for spirometry  Let us know if you develop any new or worsening symptoms   - Ambulatory referral to Allergy - chlorpheniramine (CHLOR-TRIMETON) 4 MG tablet; Take 1 tablet (4 mg total) by mouth at bedtime as needed for allergies or rhinitis.  Dispense: 14 tablet; Refill: 0 - benzonatate (TESSALON) 200 MG capsule; Take 1 capsule (200 mg total) by mouth 2 (two) times daily as needed for cough.  Dispense: 30 capsule; Refill: 1 - albuterol (VENTOLIN HFA) 108 (90 Base) MCG/ACT inhaler; Inhale 2 puffs into the lungs every 6 (six) hours as needed for wheezing.  Dispense: 2 each; Refill: 11   Establish care: Education provided today during visit and on AVS for patient to review at home.  Diet and Exercise recommendations provided.  Current diagnoses and recommendations discussed. HM recommendations reviewed with recommendations.    Outpatient Encounter Medications as of 08/13/2021  Medication Sig   acetaminophen (TYLENOL) 500 MG tablet Take 1,000 mg by mouth every 6 (six) hours as needed.   albuterol (VENTOLIN HFA) 108 (90 Base) MCG/ACT inhaler Inhale 2 puffs into the lungs every 6 (six) hours as needed for wheezing.   benzonatate (TESSALON) 200 MG capsule Take 1 capsule (200 mg total) by mouth 2 (two) times daily as needed for cough.   Biotin 1000 MCG tablet Take 1,000 mcg by mouth daily.   chlorpheniramine (CHLOR-TRIMETON) 4 MG tablet Take 1 tablet (4 mg total) by mouth at bedtime as needed for allergies or rhinitis.   fluticasone (FLONASE) 50 MCG/ACT nasal spray Place 2 sprays into both nostrils daily.   ibuprofen (ADVIL) 200 MG tablet Take 200 mg by mouth every 6 (six) hours as needed.    loratadine (CLARITIN) 10 MG tablet Take 10 mg by mouth daily.   Multiple Vitamins-Minerals (MULTIVITAMIN WITH MINERALS) tablet Take 1 tablet by mouth daily.   Facility-Administered Encounter Medications as of 08/13/2021  Medication   0.9 %  sodium chloride infusion    Return for physical at your convenience .    [facility] N[employer] Olevia Bowens, DNP, FNP-C

## 2021-09-30 ENCOUNTER — Encounter: Payer: 59 | Admitting: Family Medicine

## 2021-10-01 ENCOUNTER — Ambulatory Visit (INDEPENDENT_AMBULATORY_CARE_PROVIDER_SITE_OTHER): Payer: 59 | Admitting: Family Medicine

## 2021-10-01 ENCOUNTER — Encounter: Payer: Self-pay | Admitting: Family Medicine

## 2021-10-01 VITALS — BP 120/85 | HR 94 | Ht 65.0 in | Wt 133.2 lb

## 2021-10-01 DIAGNOSIS — J329 Chronic sinusitis, unspecified: Secondary | ICD-10-CM

## 2021-10-01 DIAGNOSIS — J40 Bronchitis, not specified as acute or chronic: Secondary | ICD-10-CM

## 2021-10-01 MED ORDER — AMOXICILLIN-POT CLAVULANATE 875-125 MG PO TABS: 1.0000 | ORAL_TABLET | Freq: Two times a day (BID) | ORAL | 0 refills | Status: AC

## 2021-10-01 MED ORDER — PREDNISONE 20 MG PO TABS: 40.0000 mg | ORAL_TABLET | Freq: Every day | ORAL | 0 refills | Status: AC

## 2021-10-01 NOTE — Patient Instructions (Signed)
Given duration of your symptoms, let's try treating with antibiotics and a steroid. (Information sheets attached).  ?Continue supportive measures including rest, hydration, humidifier use, steam showers, warm compresses to sinuses, warm liquids with lemon and honey, and over-the-counter cough, cold, and analgesics as needed.   ? ?Please contact office for follow-up if symptoms do not improve or worsen. Seek emergency care if symptoms become severe. ? ?

## 2021-10-01 NOTE — Progress Notes (Signed)
? ?Acute Office Visit ? ?Subjective:  ? ? Patient ID: [name], female    DOB: 08-19-1965, 56 y.o.   MRN: [id number] ? ?CC: cough, sinus infection  ? ? ?HPI ?Patient is in today for cough, sinus infection ? ?Patient reports that for the past 2 weeks at least she has been having URI symptoms that were mildly responding to OTC medications, but now seem to be gradually worsening. Her symptoms have included chest congestion, cough with green sputum, headaches, sinus pressure, nasal congestion, wheezing. She denies any fevers, body aches, chills, dizziness, chest pain, GI/GU symptoms. She is working on making an appointment with the allergist based on last visit when she discussed chronic intermittent cough with dyspnea and wheezing on exertion, concern for allergies/asthma.  ? ? ? ? ?Past Medical History:  ?Diagnosis Date  ? Allergy   ? Endometrial polyp   ? Iron deficiency anemia   ? Wears contact lenses   ? ? ?Past Surgical History:  ?Procedure Laterality Date  ? BREAST BIOPSY Right 08/13/2012  ? Procedure: BREAST BIOPSY WITH NEEDLE LOCALIZATION;  Surgeon: Edward Jolly, MD;  Location: [facility];  Service: General;  Laterality: Right;   lobular neoplasia  ? CESAREAN SECTION   1988/  02-13-2003/   06-27-2004  ? LAST C/S  W/ BILATERAL TUBAL LIGATION  ? DILATATION & CURETTAGE/HYSTEROSCOPY WITH MYOSURE  05/09/2015  ? Procedure: Jacklynn Barnacle;  Surgeon: Arvella Nigh, MD;  Location: [facility];  Service: Gynecology;;  ? DILATATION & CURRETTAGE/HYSTEROSCOPY WITH RESECTOCOPE N/A 08/15/2013  ? Procedure: [place];  Surgeon: Darlyn Chamber, MD;  Location: [facility];  Service: Gynecology;  Laterality: N/A;  ? DILITATION & CURRETTAGE/HYSTROSCOPY WITH HYDROTHERMAL ABLATION N/A 05/09/2015  ? Procedure: DILATATION & CURETTAGE/HYSTEROSCOPY WITH HYDROTHERMAL ABLATION;  Surgeon: Arvella Nigh, MD;  Location: [facility];  Service: Gynecology;   Laterality: N/A;  ? MULTIPLE TOOTH EXTRACTIONS  2018  ? had all upper teeth pulled  ? TUBAL LIGATION    ? ? ?Family History  ?Problem Relation Age of Onset  ? Hyperlipidemia Mother   ? Hypertension Mother   ? Arthritis Mother   ? Colon cancer Mother 1  ? Cancer Mother   ?     colon  ? Alzheimer's disease Mother 69  ?     mild  ? Dementia Mother   ? Arthritis Father   ? Hypertension Father   ? Hyperlipidemia Father   ? Cancer Father 1  ?     leukemia (?CLL?)  ? Stroke Brother   ? Hypertension Brother   ? Asthma Son   ?     exercise-induced  ? Stroke Maternal Aunt 74  ? Heart disease Maternal Aunt 72  ?     CABG  ? Stroke Maternal Grandmother   ? Diabetes Neg Hx   ? Esophageal cancer Neg Hx   ? Rectal cancer Neg Hx   ? Stomach cancer Neg Hx   ? ? ?Social History  ? ?Socioeconomic History  ? Marital status: Married  ?  Spouse name: Not on file  ? Number of children: 3  ? Years of education: Not on file  ? Highest education level: Not on file  ?Occupational History  ? Occupation: Emergency planning/management officer  ?Tobacco Use  ? Smoking status: Never  ? Smokeless tobacco: Never  ?Vaping Use  ? Vaping Use: Never used  ?Substance and Sexual Activity  ? Alcohol use: No  ?  Alcohol/week: 0.0  standard drinks  ? Drug use: No  ? Sexual activity: Yes  ?  Partners: Male  ?  Birth control/protection: Surgical  ?Other Topics Concern  ? Not on file  ?Social History Narrative  ? Lives with husband, 2 sons (has 3 sons; oldest son is grown, is in prison--due to come home 12/2019).   ? 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   ? Youngest son attends [employer].  ? Middle son--student [employer] (for dance)  ? ?Social Determinants of Health  ? ?Financial Resource Strain: Not on file  ?Food Insecurity: Not on file  ?Transportation Needs: Not on file  ?Physical Activity: Not on file  ?Stress: Not on file  ?Social Connections: Not on file  ?Intimate Partner Violence: Not on file  ? ? ?Outpatient Medications Prior to Visit  ?Medication Sig Dispense Refill  ?  acetaminophen (TYLENOL) 500 MG tablet Take 1,000 mg by mouth every 6 (six) hours as needed.    ? albuterol (VENTOLIN HFA) 108 (90 Base) MCG/ACT inhaler Inhale 2 puffs into the lungs every 6 (six) hours as needed for wheezing. 2 each 11  ? Biotin 1000 MCG tablet Take 1,000 mcg by mouth daily.    ? chlorpheniramine (CHLOR-TRIMETON) 4 MG tablet Take 1 tablet (4 mg total) by mouth at bedtime as needed for allergies or rhinitis. 14 tablet 0  ? fluticasone (FLONASE) 50 MCG/ACT nasal spray Place 2 sprays into both nostrils daily. 16 g 11  ? ibuprofen (ADVIL) 200 MG tablet Take 200 mg by mouth every 6 (six) hours as needed.    ? loratadine (CLARITIN) 10 MG tablet Take 10 mg by mouth daily.    ? Multiple Vitamins-Minerals (MULTIVITAMIN WITH MINERALS) tablet Take 1 tablet by mouth daily.    ? benzonatate (TESSALON) 200 MG capsule Take 1 capsule (200 mg total) by mouth 2 (two) times daily as needed for cough. 30 capsule 1  ? ?Facility-Administered Medications Prior to Visit  ?Medication Dose Route Frequency Provider Last Rate Last Admin  ? 0.9 %  sodium chloride infusion  500 mL Intravenous Once Doran Stabler, MD      ? ? ?No Known Allergies ? ?Review of Systems ?All review of systems negative except what is listed in the HPI ? ?   ?Objective:  ?  ?Physical Exam ?Vitals reviewed.  ?Constitutional:   ?   Appearance: Normal appearance. She is normal weight.  ?HENT:  ?   Head: Normocephalic and atraumatic.  ?   Right Ear: Tympanic membrane normal.  ?   Left Ear: Tympanic membrane normal.  ?   Nose: Nose normal.  ?   Mouth/Throat:  ?   Mouth: Mucous membranes are moist.  ?   Pharynx: Oropharynx is clear.  ?Eyes:  ?   Conjunctiva/sclera: Conjunctivae normal.  ?Cardiovascular:  ?   Rate and Rhythm: Normal rate and regular rhythm.  ?Pulmonary:  ?   Effort: Pulmonary effort is normal.  ?   Breath sounds: Wheezing present. No rhonchi or rales.  ?Musculoskeletal:  ?   Cervical back: Normal range of motion and neck supple.  ?Skin: ?    General: Skin is warm and dry.  ?Neurological:  ?   General: No focal deficit present.  ?   Mental Status: She is alert and oriented to person, place, and time. Mental status is at baseline.  ?Psychiatric:     ?   Mood and Affect: Mood normal.     ?   Behavior: Behavior normal.     ?  Thought Content: Thought content normal.     ?   Judgment: Judgment normal.  ? ? ?BP 120/85   Pulse 94   Ht '5\' 5"'$  (1.651 m)   Wt 133 lb 3.2 oz (60.4 kg)   SpO2 100%   BMI 22.17 kg/m?  ?Wt Readings from Last 3 Encounters:  ?10/01/21 133 lb 3.2 oz (60.4 kg)  ?08/13/21 139 lb 3.2 oz (63.1 kg)  ?10/16/20 160 lb 6.4 oz (72.8 kg)  ? ? ?Health Maintenance Due  ?Topic Date Due  ? HIV Screening  Never done  ? Hepatitis C Screening  Never done  ? Zoster Vaccines- Shingrix (1 of 2) Never done  ? ? ?There are no preventive care reminders to display for this patient. ? ? ?Lab Results  ?Component Value Date  ? TSH 0.672 09/14/2017  ? ?Lab Results  ?Component Value Date  ? WBC 4.1 09/19/2019  ? HGB 13.6 09/19/2019  ? HCT 40.5 09/19/2019  ? MCV 94 09/19/2019  ? PLT 276 09/19/2019  ? ?Lab Results  ?Component Value Date  ? NA 137 09/19/2019  ? K 4.0 09/19/2019  ? CO2 23 09/19/2019  ? GLUCOSE 93 09/19/2019  ? BUN 16 09/19/2019  ? CREATININE 0.86 09/19/2019  ? BILITOT 0.4 09/19/2019  ? ALKPHOS 68 09/19/2019  ? AST 21 09/19/2019  ? ALT 11 09/19/2019  ? PROT 7.6 09/19/2019  ? ALBUMIN 4.6 09/19/2019  ? CALCIUM 9.8 09/19/2019  ? ?Lab Results  ?Component Value Date  ? CHOL 149 09/19/2019  ? ?Lab Results  ?Component Value Date  ? HDL 70 09/19/2019  ? ?Lab Results  ?Component Value Date  ? [place] 72 09/19/2019  ? ?Lab Results  ?Component Value Date  ? TRIG 23 09/19/2019  ? ?Lab Results  ?Component Value Date  ? CHOLHDL 2.1 09/19/2019  ? ?No results found for: HGBA1C ? ?   ?Assessment & Plan:  ? ?1. Sinobronchitis ?Given duration of your symptoms, let's try treating with antibiotics and a steroid. (Information sheets attached).  ?Continue supportive  measures including rest, hydration, humidifier use, steam showers, warm compresses to sinuses, warm liquids with lemon and honey, and over-the-counter cough, cold, and analgesics as needed.   ? ?- amoxicillin-clavulanate

## 2021-11-29 ENCOUNTER — Encounter: Payer: Self-pay | Admitting: Internal Medicine

## 2021-11-29 ENCOUNTER — Ambulatory Visit (INDEPENDENT_AMBULATORY_CARE_PROVIDER_SITE_OTHER): Payer: 59 | Admitting: Internal Medicine

## 2021-11-29 VITALS — BP 106/66 | HR 94 | Temp 97.9°F | Resp 16 | Ht 63.0 in | Wt 135.2 lb

## 2021-11-29 DIAGNOSIS — K219 Gastro-esophageal reflux disease without esophagitis: Secondary | ICD-10-CM | POA: Diagnosis not present

## 2021-11-29 DIAGNOSIS — J302 Other seasonal allergic rhinitis: Secondary | ICD-10-CM

## 2021-11-29 DIAGNOSIS — R053 Chronic cough: Secondary | ICD-10-CM | POA: Diagnosis not present

## 2021-11-29 DIAGNOSIS — J3089 Other allergic rhinitis: Secondary | ICD-10-CM

## 2021-11-29 MED ORDER — OMEPRAZOLE 20 MG PO CPDR: 20.0000 mg | DELAYED_RELEASE_CAPSULE | Freq: Every day | ORAL | 5 refills | Status: AC

## 2021-11-29 MED ORDER — FLUTICASONE PROPIONATE 50 MCG/ACT NA SUSP: 2.0000 | Freq: Every day | NASAL | 5 refills | Status: AC

## 2021-11-29 MED ORDER — FLUTICASONE PROPIONATE HFA 44 MCG/ACT IN AERO: 2.0000 | INHALATION_SPRAY | Freq: Two times a day (BID) | RESPIRATORY_TRACT | 12 refills | Status: AC

## 2021-11-29 NOTE — Progress Notes (Signed)
New Patient Note  RE: [name] MRN: [id number] DOB: [birth year] Date of Office Visit: 11/29/2021  Consult requested by: Terrilyn Saver, NP Primary care provider: Terrilyn Saver, NP  Chief Complaint: Cough (2 years time frame. Switches from dry to productive cough. Sometimes has a color to the mucous but not all the time) and Other (Watery itchy eyes that get red from time to time. Dry itchy throat at times.)  History of Present Illness: I had the pleasure of seeing Hadessah Grennan for initial evaluation at the [facility] on 11/29/2021. She is a 56 y.o. female, who is referred here by Terrilyn Saver, NP for the evaluation of chronic cough, recurrent sinusitis .  History obtained from patient.  Cough: Initially started 3-4 years , now occurring daily . Worse at night  Associates: wheezing, post nasal drainage, Trial of OCS: Yes  Trial of Inhalers: albuterol prescribed in 2/23, not tried ICS  History of Reflux: Denies  History of post nasal drainage: Yes Triggers:  fume exposure, heat, rhinitis , exercise,, and respiratory illness, Therapies tried: Flonase, claritin, cough drops Taking an ACE-I or ARB: denies   Up-to-date with pneumonia Covid-19 Flu vaccines. History of prior pneumonias: denies  History of prior COVID-19 infection: denies  Smoking history/exposure: denies   Chronic rhinitis: started 5 years  Symptoms include:  cough , nasal congestion, rhinorrhea, post nasal drainage, and sneezing  Occurs seasonally-spring  Potential triggers: denies  Treatments tried: Flonase, claritin, cough drops Previous allergy testing: no History of reflux/heartburn: no History of chronic sinusitis or sinus surgery: no Nonallergic triggers:  Denies       Assessment and Plan: Larinda is a 56 y.o. female with: Chronic cough - Plan: Spirometry with Graph, Allergy Test  Gastroesophageal reflux disease without esophagitis  Other allergic rhinitis -  Plan: Allergy Test  Seasonal allergies - continue claritin; add flonase (pt has some at home already, hasn't used); sudafed prn Plan: Patient Instructions   Chronic Cough Etiology of chronic cough is broad. Common considerations include asthma, COPD, allergic rhinitis, nonallergic rhinitis, infections, reflux (GERD/LPR), neurogenic and/or habitual cough.  Mainstay of treatment is to control all possible triggers and address the cough hypersensitivity aspect.   The history and physical examination suggest this cough is multifactorial and potentially attributed to  Rhinitis, GERD, cigarette exposure, perfumes/irritants, and habitual.  We will address  rhinitis, GERD, perfumes/irritants, and habitual cough at this time.   Allergy Testing today showed: Positive to weeds, trees, mold, dust mite, mixed feathers, tobacco leaf, borderline to grass, horse, roach, mouse Avoidance measures provided   Breathing tests look normal, although this does not rule out asthma and makes it less likely as a cause of your cough at this time  PLAN:  -Start Flovent 67mg 2 puffs twice daily with spacer   - You need to wash mouth out after use  -Start Omeprazole '20mg'$  daily for reflux  -Start Levocetirizine '5mg'$  daily  -Stop Claritin  -Continue Flonase 563m 2 puffs twice daily    Follow up: 4 weeks   Thank you so much for letting me partake in your care today.  Don't hesitate to reach out if you have any additional concerns!  EvRoney MarionMD  [facility] [place] [place]    No follow-ups on file.  Meds ordered this encounter  Medications   fluticasone (FLOVENT HFA) 44 MCG/ACT inhaler    Sig: Inhale 2 puffs into the lungs 2 (two)  times daily.    Dispense:  1 each    Refill:  12   omeprazole (PRILOSEC) 20 MG capsule    Sig: Take 1 capsule (20 mg total) by mouth daily.    Dispense:  30 capsule    Refill:  5   fluticasone (FLONASE) 50 MCG/ACT nasal spray    Sig: Place 2 sprays into  both nostrils daily.    Dispense:  16 g    Refill:  5   Lab Orders  No laboratory test(s) ordered today    Other allergy screening: Asthma: no Rhino conjunctivitis: yes Food allergy: no Medication allergy: no Hymenoptera allergy: no Urticaria: no Eczema:no History of recurrent infections suggestive of immunodeficency: no  Diagnostics: Spirometry:  Tracings reviewed. Her effort: Good reproducible efforts. FVC: 2.40 L FEV1: 2.08 L, 98% predicted FEV1/FVC ratio: 87% Interpretation: Spirometry consistent with normal pattern.  Please see scanned spirometry results for details.  Skin Testing: Environmental allergy panel. Positive to weeds, trees, mold, dust mite, mixed feathers, tobacco leaf, borderline to grass, horse, roach, mouse Results interpreted by myself and discussed with patient/family.  Airborne Adult Perc - 11/29/21 1507     Time Antigen Placed 1527    Allergen Manufacturer Lavella Hammock    Location Back    Number of Test 59    1. Control-Buffer 50% Glycerol Negative    2. Control-Histamine 1 mg/ml 4+    3. Albumin saline Negative    4. Blue [place] Negative    5. [place] Negative    6. [place] 2+    7. [place] Blue Negative    8. Meadow Fescue Negative    9. Perennial Rye Negative    10. Sweet Vernal Negative    11. Timothy Negative    12. Cocklebur 2+    13. Burweed Marshelder 2+    14. Ragweed, short Negative    15. Ragweed, Giant Negative    16. Plantain,  English Negative    17. Lamb's Quarters Negative    18. Sheep Sorrell 3+    19. Rough Pigweed 3+    20. Marsh Elder, Rough 2+    21. Mugwort, Common Negative    22. Ash mix Negative    23. Birch mix 2+    24. Beech American 2+    25. Box, Elder 4+    26. Cedar, red Negative    27. Cottonwood, [place] Negative    28. Elm mix 2+    29. Hickory 2+    30. Maple mix 4+    31. Oak, [place] mix 2+    32. Pecan Pollen Negative    33. Pine mix Negative    34. Sycamore Eastern Negative    35. Plain, Black  Pollen 4+    36. Alternaria alternata Negative    37. Cladosporium Herbarum Negative    38. Aspergillus mix 2+    39. Penicillium mix Negative    40. Bipolaris sorokiniana (Helminthosporium) Negative    41. Drechslera spicifera (Curvularia) Negative    42. Mucor plumbeus 3+    43. Fusarium moniliforme Negative    44. Aureobasidium pullulans (pullulara) 2+    45. Rhizopus oryzae 2+    46. Botrytis cinera Negative    47. Epicoccum nigrum 2+    48. Phoma betae 2+    49. Candida Albicans Negative    50. Trichophyton mentagrophytes 2+    51. Mite, D Farinae  5,000 AU/ml Negative    52. Mite, D Pteronyssinus  5,000 AU/ml 3+  53. Cat Hair 10,000 BAU/ml Negative    54.  Dog Epithelia Negative    55. Mixed Feathers 4+    56. Horse Epithelia 2+    57. Cockroach, German 2+    58. Mouse 2+    59. 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    Class: Present on Admission   Anemia 02/01/2015   Diverticulosis of colon without hemorrhage 02/01/2015   Constipation 02/01/2015   Allergic rhinitis 11/02/2013   Endometrial polyp 08/15/2013    Class: Present on Admission   Inguinal hernia, left 02/10/2011   Family history of hyperlipidemia 02/10/2011   Past Medical History:  Diagnosis Date   Allergy    Endometrial polyp    Iron deficiency anemia    Wears contact lenses    Past Surgical History: Past Surgical History:  Procedure Laterality Date   BREAST BIOPSY Right 08/13/2012   Procedure: BREAST BIOPSY WITH NEEDLE LOCALIZATION;  Surgeon: Edward Jolly, MD;  Location: [facility];  Service: General;  Laterality: Right;   lobular neoplasia   CESAREAN SECTION   1988/  02-13-2003/   06-27-2004   LAST C/S  W/ BILATERAL TUBAL LIGATION   DILATATION & CURETTAGE/HYSTEROSCOPY WITH MYOSURE  05/09/2015   Procedure: Jacklynn Barnacle;  Surgeon: Arvella Nigh, MD;  Location: [facility];  Service:  Gynecology;;   DILATATION & CURRETTAGE/HYSTEROSCOPY WITH RESECTOCOPE N/A 08/15/2013   Procedure: [place];  Surgeon: Darlyn Chamber, MD;  Location: [facility];  Service: Gynecology;  Laterality: N/A;   DILITATION & CURRETTAGE/HYSTROSCOPY WITH HYDROTHERMAL ABLATION N/A 05/09/2015   Procedure: DILATATION & CURETTAGE/HYSTEROSCOPY WITH HYDROTHERMAL ABLATION;  Surgeon: Arvella Nigh, MD;  Location: [facility];  Service: Gynecology;  Laterality: N/A;   MULTIPLE TOOTH EXTRACTIONS  2018   had all upper teeth pulled   TUBAL LIGATION     Medication List:  Current Outpatient Medications  Medication Sig Dispense Refill   acetaminophen (TYLENOL) 500 MG tablet Take 1,000 mg by mouth every 6 (six) hours as needed.     albuterol (VENTOLIN HFA) 108 (90 Base) MCG/ACT inhaler Inhale 2 puffs into the lungs every 6 (six) hours as needed for wheezing. 2 each 11   cetirizine (ZYRTEC) 10 MG tablet Take 10 mg by mouth daily.     fluticasone (FLOVENT HFA) 44 MCG/ACT inhaler Inhale 2 puffs into the lungs 2 (two) times daily. 1 each 12   ibuprofen (ADVIL) 200 MG tablet Take 200 mg by mouth every 6 (six) hours as needed.     Multiple Vitamins-Minerals (MULTIVITAMIN WITH MINERALS) tablet Take 1 tablet by mouth daily.     omeprazole (PRILOSEC) 20 MG capsule Take 1 capsule (20 mg total) by mouth daily. 30 capsule 5   Biotin 1000 MCG tablet Take 1,000 mcg by mouth daily. (Patient not taking: Reported on 11/29/2021)     chlorpheniramine (CHLOR-TRIMETON) 4 MG tablet Take 1 tablet (4 mg total) by mouth at bedtime as needed for allergies or rhinitis. (Patient not taking: Reported on 11/29/2021) 14 tablet 0   fluticasone (FLONASE) 50 MCG/ACT nasal spray Place 2 sprays into both nostrils daily. 16 g 5   loratadine (CLARITIN) 10 MG tablet Take 10 mg by mouth daily. (Patient not taking: Reported on 11/29/2021)     Current Facility-Administered Medications  Medication Dose Route  Frequency Provider Last Rate Last  Admin   0.9 %  sodium chloride infusion  500 mL Intravenous Once Danis, Kirke Corin, MD       Allergies: No Known Allergies Social History: Social History   Socioeconomic History   Marital status: Married    Spouse name: Not on file   Number of children: 3   Years of education: Not on file   Highest education level: Not on file  Occupational History   Occupation: Emergency planning/management officer  Tobacco Use   Smoking status: Never    Passive exposure: Never   Smokeless tobacco: Never  Vaping Use   Vaping Use: Never used  Substance and Sexual Activity   Alcohol use: No    Alcohol/week: 0.0 standard drinks of alcohol   Drug use: No   Sexual activity: Yes    Partners: Male    Birth control/protection: Surgical  Other Topics Concern   Not on file  Social History Narrative   Lives with husband, 2 sons (has 3 sons; oldest son is grown, is in prison--due to come home 12/2019).    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    Youngest son attends [employer].   Middle son--student [employer] (for dance)   Social Determinants of Health   Financial Resource Strain: Not on file  Food Insecurity: Not on file  Transportation Needs: Not on file  Physical Activity: Not on file  Stress: Not on file  Social Connections: Not on file   Lives in a single-family home is 56 years old.  There are no roaches in the house and bed is 2 feet off the floor.  She does have dust mite precautions on the bed but not pillows.  She does have a job where she is exposed to fumes, chemicals and dust.  She does not have a HEPA filter in her home and home is not near an interstate industrial area. Smoking: No exposure Occupation: Pension scheme manager HistoryFreight forwarder in the house: no Charity fundraiser in the family room: no Carpet in the bedroom: no Heating: electric Cooling: central Pet: yes dog with access to bedroom  Family History: Family History  Problem Relation Age of Onset    Hyperlipidemia Mother    Hypertension Mother    Arthritis Mother    Colon cancer Mother 47   Cancer Mother        colon   Alzheimer's disease Mother 39       mild   Dementia Mother    Arthritis Father    Hypertension Father    Hyperlipidemia Father    Cancer Father 4       leukemia (?CLL?)   Stroke Brother    Hypertension Brother    Stroke Maternal Aunt 74   Heart disease Maternal Aunt 72       CABG   Stroke Maternal Grandmother    Asthma Son        exercise-induced   Allergic rhinitis Son    Diabetes Neg Hx    Esophageal cancer Neg Hx    Rectal cancer Neg Hx    Stomach cancer Neg Hx      ROS: All others negative except as noted per HPI.   Objective: BP 106/66   Pulse 94   Temp 97.9 F (36.6 C) (Temporal)   Resp 16   Ht '5\' 3"'$  (1.6 m)   Wt 135 lb 3.2 oz (61.3 kg)   SpO2 100%   BMI 23.95 kg/m  Body mass index is 23.95 kg/m.  General Appearance:  Alert, cooperative, no distress, appears stated age  Head:  Normocephalic, without obvious abnormality, atraumatic  Eyes:  Conjunctiva clear, EOM's intact  Nose: Nares normal,  erythematous nasal mucosa, hypertrophic turbinates, no visible anterior polyps, and septum midline  Throat: Lips, tongue normal; teeth and gums normal, normal posterior oropharynx and no tonsillar exudate  Neck: Supple, symmetrical  Lungs:   clear to auscultation bilaterally, Respirations unlabored,  bronchitic cough   Heart:  regular rate and rhythm and no murmur, Appears well perfused  Extremities: No edema  Skin: Skin color, texture, turgor normal, no rashes or lesions on visualized portions of skin  Neurologic: No gross deficits   The plan was reviewed with the patient/family, and all questions/concerned were addressed.  It was my pleasure to see Marilene today and participate in her care. Please feel free to contact me with any questions or concerns.  Sincerely,  Roney Marion, MD Allergy & Immunology  [facility] office: [phone] [facility] office: [phone]

## 2021-11-29 NOTE — Patient Instructions (Addendum)
Chronic Cough Etiology of chronic cough is broad. Common considerations include asthma, COPD, allergic rhinitis, nonallergic rhinitis, infections, reflux (GERD/LPR), neurogenic and/or habitual cough.  Mainstay of treatment is to control all possible triggers and address the cough hypersensitivity aspect.   The history and physical examination suggest this cough is multifactorial and potentially attributed to  Rhinitis, GERD, cigarette exposure, perfumes/irritants, and habitual.  We will address  rhinitis, GERD, perfumes/irritants, and habitual cough at this time.   Allergy Testing today showed: Positive to weeds, trees, mold, dust mite, mixed feathers, tobacco leaf, borderline to grass, horse, roach, mouse Avoidance measures provided   Breathing tests look normal, although this does not rule out asthma and makes it less likely as a cause of your cough at this time  PLAN:  -Start Flovent 67mg 2 puffs twice daily with spacer   - You need to wash mouth out after use  -Start Omeprazole '20mg'$  daily for reflux  -Start Levocetirizine '5mg'$  daily  -Stop Claritin  -Continue Flonase 547m 2 puffs twice daily    Follow up: 4 weeks   Thank you so much for letting me partake in your care today.  Don't hesitate to reach out if you have any additional concerns!  EvRoney MarionMD  [facility]

## 2021-12-27 ENCOUNTER — Ambulatory Visit: Payer: 59 | Admitting: Internal Medicine

## 2021-12-27 DIAGNOSIS — J309 Allergic rhinitis, unspecified: Secondary | ICD-10-CM

## 2022-01-13 ENCOUNTER — Ambulatory Visit (INDEPENDENT_AMBULATORY_CARE_PROVIDER_SITE_OTHER): Payer: Self-pay | Admitting: Family Medicine

## 2022-01-13 ENCOUNTER — Encounter: Payer: Self-pay | Admitting: Family Medicine

## 2022-01-13 VITALS — BP 111/72 | HR 84 | Ht 63.0 in | Wt 137.6 lb

## 2022-01-13 DIAGNOSIS — Z1231 Encounter for screening mammogram for malignant neoplasm of breast: Secondary | ICD-10-CM

## 2022-01-13 DIAGNOSIS — Z0000 Encounter for general adult medical examination without abnormal findings: Secondary | ICD-10-CM

## 2022-01-13 LAB — COMPREHENSIVE METABOLIC PANEL
ALT: 18 U/L (ref 0–35)
AST: 24 U/L (ref 0–37)
Albumin: 4.6 g/dL (ref 3.5–5.2)
Alkaline Phosphatase: 61 U/L (ref 39–117)
BUN: 18 mg/dL (ref 6–23)
CO2: 28 mEq/L (ref 19–32)
Calcium: 9.9 mg/dL (ref 8.4–10.5)
Chloride: 104 mEq/L (ref 96–112)
Creatinine, Ser: 0.97 mg/dL (ref 0.40–1.20)
GFR: 65.64 mL/min (ref >60.00)
Glucose, Bld: 105 mg/dL — ABNORMAL HIGH (ref 70–99)
Potassium: 4.3 mEq/L (ref 3.5–5.1)
Sodium: 141 mEq/L (ref 135–145)
Total Bilirubin: 0.4 mg/dL (ref 0.2–1.2)
Total Protein: 7.2 g/dL (ref 6.0–8.3)

## 2022-01-13 LAB — CBC
HCT: 39.6 % (ref 36.0–46.0)
Hemoglobin: 13.4 g/dL (ref 12.0–15.0)
MCHC: 33.7 g/dL (ref 30.0–36.0)
MCV: 94.4 fl (ref 78.0–100.0)
Platelets: 245 10*3/uL (ref 150.0–400.0)
RBC: 4.2 Mil/uL (ref 3.87–5.11)
RDW: 12.3 % (ref 11.5–15.5)
WBC: 5.4 10*3/uL (ref 4.0–10.5)

## 2022-01-13 LAB — LIPID PANEL
Cholesterol: 166 mg/dL (ref 0–200)
HDL: 83.9 mg/dL (ref >39.00)
LDL Cholesterol: 77 mg/dL (ref 0–99)
NonHDL: 82.54
Total CHOL/HDL Ratio: 2
Triglycerides: 30 mg/dL (ref 0.0–149.0)
VLDL: 6 mg/dL (ref 0.0–40.0)

## 2022-01-13 LAB — TSH: TSH: 1.02 u[IU]/mL (ref 0.35–5.50)

## 2022-01-13 NOTE — Progress Notes (Signed)
Complete physical exam  Patient: [name]   DOB: 11-12-1965   56 y.o. Female  MRN: [id number]  Subjective:    CC: CPE, no complaints    [name] is a 56 y.o. female who presents today for a complete physical exam. She reports consuming a general diet. Home exercise routine includes variety of exercises home and classes. She generally feels well. She reports sleeping well. She does not have additional problems to discuss today.   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    Most recent fall risk assessment:    01/13/2022    8:31 AM  [place] in the past year? 0  Number falls in past yr: 0  Injury with Fall? 0  Risk for fall due to : No Fall Risks  Follow up Falls evaluation completed     Most recent depression screenings:    01/13/2022    8:31 AM 09/19/2020   10:02 AM  PHQ 2/9 Scores  PHQ - 2 Score 0 0    Vision:Not within last year , Dental: No current dental problems and No regular dental care , and STD: no concerns    Patient Care Team: Terrilyn Saver, NP as PCP - General (Family Medicine)   Outpatient Medications Prior to Visit  Medication Sig   acetaminophen (TYLENOL) 500 MG tablet Take 1,000 mg by mouth every 6 (six) hours as needed.   albuterol (VENTOLIN HFA) 108 (90 Base) MCG/ACT inhaler Inhale 2 puffs into the lungs every 6 (six) hours as needed for wheezing.   Biotin 1000 MCG tablet Take 1,000 mcg by mouth daily.   cetirizine (ZYRTEC) 10 MG tablet Take 10 mg by mouth daily.   chlorpheniramine (CHLOR-TRIMETON) 4 MG tablet Take 1 tablet (4 mg total) by mouth at bedtime as needed for allergies or rhinitis.   fluticasone (FLONASE) 50 MCG/ACT nasal spray Place 2 sprays into both nostrils daily.   fluticasone (FLOVENT HFA) 44 MCG/ACT inhaler Inhale 2 puffs into the lungs 2 (two) times daily.   ibuprofen (ADVIL) 200 MG tablet Take 200 mg by mouth every 6 (six) hours as needed.    loratadine (CLARITIN) 10 MG tablet Take 10 mg by mouth daily.   Multiple Vitamins-Minerals (MULTIVITAMIN WITH MINERALS) tablet Take 1 tablet by mouth daily.   omeprazole (PRILOSEC) 20 MG capsule Take 1 capsule (20 mg total) by mouth daily.   Facility-Administered Medications Prior to Visit  Medication Dose Route Frequency Provider   0.9 %  sodium chloride infusion  500 mL Intravenous Once Nelida Meuse III, MD    ROS All review of systems negative except what is listed in the HPI       Objective:     BP 111/72   Pulse 84   Ht '5\' 3"'$  (1.6 m)   Wt 137 lb 9.6 oz (62.4 kg)   BMI 24.37 kg/m    Physical Exam Vitals reviewed.  Constitutional:      General: She is not in acute distress.    Appearance: Normal appearance. She is not ill-appearing.  HENT:     Head: Normocephalic and atraumatic.     Right Ear: Tympanic membrane normal.     Left Ear: Tympanic membrane normal.     Nose: Nose normal.     Mouth/Throat:     Mouth: Mucous membranes are moist.     Pharynx: Oropharynx is clear.  Eyes:     Extraocular Movements:  Extraocular movements intact.     Conjunctiva/sclera: Conjunctivae normal.     Pupils: Pupils are equal, round, and reactive to light.  Neck:     Vascular: No carotid bruit.  Cardiovascular:     Rate and Rhythm: Normal rate and regular rhythm.     Pulses: Normal pulses.     Heart sounds: Normal heart sounds.  Pulmonary:     Effort: Pulmonary effort is normal.     Breath sounds: Normal breath sounds.  Abdominal:     General: Abdomen is flat. Bowel sounds are normal. There is no distension.     Palpations: Abdomen is soft. There is no mass.     Tenderness: There is no abdominal tenderness. There is no right CVA tenderness, left CVA tenderness, guarding or rebound.  Genitourinary:    Comments: Deferred exam Musculoskeletal:        General: Normal range of motion.     Cervical back: Normal range of motion and neck supple. No tenderness.     Right lower  leg: No edema.     Left lower leg: No edema.  Lymphadenopathy:     Cervical: No cervical adenopathy.  Skin:    General: Skin is warm and dry.     Capillary Refill: Capillary refill takes less than 2 seconds.  Neurological:     General: No focal deficit present.     Mental Status: She is alert and oriented to person, place, and time. Mental status is at baseline.  Psychiatric:        Mood and Affect: Mood normal.        Behavior: Behavior normal.        Thought Content: Thought content normal.        Judgment: Judgment normal.          No results found for any visits on 01/13/22.     Assessment & Plan:    Routine Health Maintenance and Physical Exam  Immunization History  Administered Date(s) Administered   Influenza Inj Mdck Quad Pf 03/26/2019   Influenza Whole 03/23/2010   Influenza,inj,Quad PF,6+ Mos 02/14/2013, 04/08/2017, 05/02/2020   Influenza-Unspecified 04/13/2015, 03/23/2018, 07/07/2021   Moderna Covid-19 Vaccine Bivalent Booster 54yr & up 06/10/2021   PFIZER(Purple Top)SARS-COV-2 Vaccination 09/04/2019, 09/24/2019, 05/23/2020   Pfizer Covid-19 Vaccine Bivalent Booster 115yr& up 10/16/2020   Tdap 02/10/2011, 09/19/2020    Health Maintenance  Topic Date Due   HIV Screening  Never done   Hepatitis C Screening  Never done   Zoster Vaccines- Shingrix (1 of 2) Never done   INFLUENZA VACCINE  01/21/2022   PAP SMEAR-Modifier  09/15/2022   COLONOSCOPY (Pts [id number] coverage will need to be confirmed)  10/01/2022   MAMMOGRAM  03/27/2023   TETANUS/TDAP  09/20/2030   COVID-19 Vaccine  Completed   HPV VACCINES  Aged Out    Discussed health benefits of physical activity, and encouraged her to engage in regular exercise appropriate for her age and condition.  Problem List Items Addressed This Visit   None Visit Diagnoses     Annual physical exam    -  Primary   Relevant Orders   CBC   Comprehensive metabolic panel   Lipid panel   TSH   MM  DIGITAL SCREENING BILATERAL   Encounter for screening mammogram for malignant neoplasm of breast       Relevant Orders   MM DIGITAL SCREENING BILATERAL      Return in about 1 year (around 01/14/2023) for physical .  Terrilyn Saver, NP

## 2022-02-04 ENCOUNTER — Encounter: Payer: 59 | Admitting: Family Medicine

## 2022-03-24 ENCOUNTER — Other Ambulatory Visit: Payer: Self-pay | Admitting: Family Medicine

## 2022-03-24 DIAGNOSIS — Z1231 Encounter for screening mammogram for malignant neoplasm of breast: Secondary | ICD-10-CM

## 2022-04-09 ENCOUNTER — Ambulatory Visit: Payer: Self-pay

## 2022-04-09 ENCOUNTER — Ambulatory Visit
Admission: RE | Admit: 2022-04-09 | Discharge: 2022-04-09 | Disposition: A | Discharge status: Home / Self Care | Payer: 59 | Source: Ambulatory Visit | Attending: Family Medicine | Admitting: Family Medicine

## 2022-04-09 DIAGNOSIS — Z1231 Encounter for screening mammogram for malignant neoplasm of breast: Secondary | ICD-10-CM

## 2022-04-16 DIAGNOSIS — Z6822 Body mass index (BMI) 22.0-22.9, adult: Secondary | ICD-10-CM | POA: Diagnosis not present

## 2022-04-16 DIAGNOSIS — R059 Cough, unspecified: Secondary | ICD-10-CM | POA: Diagnosis not present

## 2022-04-16 DIAGNOSIS — J069 Acute upper respiratory infection, unspecified: Secondary | ICD-10-CM | POA: Diagnosis not present

## 2022-09-03 ENCOUNTER — Encounter: Payer: Self-pay | Admitting: Gastroenterology

## 2022-12-27 DIAGNOSIS — I10 Essential (primary) hypertension: Secondary | ICD-10-CM | POA: Diagnosis not present

## 2022-12-27 DIAGNOSIS — K5900 Constipation, unspecified: Secondary | ICD-10-CM | POA: Diagnosis not present

## 2022-12-27 DIAGNOSIS — Z809 Family history of malignant neoplasm, unspecified: Secondary | ICD-10-CM | POA: Diagnosis not present

## 2022-12-27 DIAGNOSIS — Z8249 Family history of ischemic heart disease and other diseases of the circulatory system: Secondary | ICD-10-CM | POA: Diagnosis not present

## 2023-01-19 ENCOUNTER — Other Ambulatory Visit (HOSPITAL_COMMUNITY)
Admission: RE | Admit: 2023-01-19 | Discharge: 2023-01-19 | Disposition: A | Discharge status: Home / Self Care | Payer: 59 | Source: Ambulatory Visit | Attending: Family Medicine | Admitting: Family Medicine

## 2023-01-19 ENCOUNTER — Ambulatory Visit (INDEPENDENT_AMBULATORY_CARE_PROVIDER_SITE_OTHER): Payer: 59 | Admitting: Family Medicine

## 2023-01-19 ENCOUNTER — Encounter: Payer: Self-pay | Admitting: Family Medicine

## 2023-01-19 VITALS — BP 110/82 | HR 78 | Ht 63.0 in | Wt 142.0 lb

## 2023-01-19 DIAGNOSIS — Z1159 Encounter for screening for other viral diseases: Secondary | ICD-10-CM

## 2023-01-19 DIAGNOSIS — Z0000 Encounter for general adult medical examination without abnormal findings: Secondary | ICD-10-CM | POA: Diagnosis not present

## 2023-01-19 DIAGNOSIS — N889 Noninflammatory disorder of cervix uteri, unspecified: Secondary | ICD-10-CM | POA: Diagnosis not present

## 2023-01-19 DIAGNOSIS — Z124 Encounter for screening for malignant neoplasm of cervix: Secondary | ICD-10-CM | POA: Diagnosis not present

## 2023-01-19 DIAGNOSIS — Z114 Encounter for screening for human immunodeficiency virus [HIV]: Secondary | ICD-10-CM | POA: Diagnosis not present

## 2023-01-19 LAB — COMPREHENSIVE METABOLIC PANEL
ALT: 16 U/L (ref 0–35)
AST: 22 U/L (ref 0–37)
Albumin: 4.2 g/dL (ref 3.5–5.2)
Alkaline Phosphatase: 56 U/L (ref 39–117)
BUN: 22 mg/dL (ref 6–23)
CO2: 27 mEq/L (ref 19–32)
Calcium: 9.4 mg/dL (ref 8.4–10.5)
Chloride: 104 mEq/L (ref 96–112)
Creatinine, Ser: 0.93 mg/dL (ref 0.40–1.20)
GFR: 68.55 mL/min (ref >60.00)
Glucose, Bld: 106 mg/dL — ABNORMAL HIGH (ref 70–99)
Potassium: 3.5 mEq/L (ref 3.5–5.1)
Sodium: 139 mEq/L (ref 135–145)
Total Bilirubin: 0.5 mg/dL (ref 0.2–1.2)
Total Protein: 6.7 g/dL (ref 6.0–8.3)

## 2023-01-19 LAB — CBC WITH DIFFERENTIAL/PLATELET
Basophils Absolute: 0 10*3/uL (ref 0.0–0.1)
Basophils Relative: 1.1 % (ref 0.0–3.0)
Eosinophils Absolute: 0 10*3/uL (ref 0.0–0.7)
Eosinophils Relative: 0.4 % (ref 0.0–5.0)
HCT: 39.1 % (ref 36.0–46.0)
Hemoglobin: 12.9 g/dL (ref 12.0–15.0)
Lymphocytes Relative: 27.8 % (ref 12.0–46.0)
Lymphs Abs: 1.2 10*3/uL (ref 0.7–4.0)
MCHC: 33 g/dL (ref 30.0–36.0)
MCV: 95.9 fl (ref 78.0–100.0)
Monocytes Absolute: 0.4 10*3/uL (ref 0.1–1.0)
Monocytes Relative: 8.3 % (ref 3.0–12.0)
Neutro Abs: 2.7 10*3/uL (ref 1.4–7.7)
Neutrophils Relative %: 62.4 % (ref 43.0–77.0)
Platelets: 234 10*3/uL (ref 150.0–400.0)
RBC: 4.08 Mil/uL (ref 3.87–5.11)
RDW: 12.6 % (ref 11.5–15.5)
WBC: 4.3 10*3/uL (ref 4.0–10.5)

## 2023-01-19 LAB — LIPID PANEL
Cholesterol: 159 mg/dL (ref 0–200)
HDL: 75.9 mg/dL (ref >39.00)
LDL Cholesterol: 78 mg/dL (ref 0–99)
NonHDL: 82.94
Total CHOL/HDL Ratio: 2
Triglycerides: 24 mg/dL (ref 0.0–149.0)
VLDL: 4.8 mg/dL (ref 0.0–40.0)

## 2023-01-19 LAB — TSH: TSH: 0.73 u[IU]/mL (ref 0.35–5.50)

## 2023-01-19 NOTE — Progress Notes (Signed)
Complete physical exam  Patient: [name]   DOB: 09-03-1965   57 y.o. Female  MRN: [id number]  Subjective:    Chief Complaint  Patient presents with   Annual Exam    [name] is a 57 y.o. female who presents today for a complete physical exam. She reports consuming a general, pescatarian diet. Home exercise routine includes HIIT and cardio 5-6 days per week. She generally feels well. She reports sleeping well. She does not have additional problems to discuss today.   Currently lives with: husband and son Acute concerns or interim problems since last visit: no  Vision concerns: no concerns Dental concerns: no concerns STD concerns: no concerns   Patient denies ETOH use. Patient denies nicotine use. Patient denies illegal substance use.     Most recent fall risk assessment:    01/19/2023    8:34 AM  Fall Risk   Falls in the past year? 0  Number falls in past yr: 0  Injury with Fall? 0  Risk for fall due to : No Fall Risks  Follow up Falls evaluation completed     Most recent depression screenings:    01/19/2023    8:34 AM 01/13/2022    8:31 AM  PHQ 2/9 Scores  PHQ - 2 Score 0 0       Patient Active Problem List   Diagnosis Date Noted   Uterine fibromyoma 05/09/2015    Class: Present on Admission   Anemia 02/01/2015   Diverticulosis of colon without hemorrhage 02/01/2015   Constipation 02/01/2015   Allergic rhinitis 11/02/2013   Endometrial polyp 08/15/2013    Class: Present on Admission   Inguinal hernia, left 02/10/2011   Family history of hyperlipidemia 02/10/2011   Family History  Problem Relation Age of Onset   Hyperlipidemia Mother    Hypertension Mother    Arthritis Mother    Colon cancer Mother 30   Cancer Mother        colon   Alzheimer's disease Mother 57       mild   Dementia Mother    Arthritis Father    Hypertension Father    Hyperlipidemia Father    Cancer Father 45       leukemia  (?CLL?)   Stroke Brother    Hypertension Brother    Stroke Maternal Aunt 74   Heart disease Maternal Aunt 72       CABG   Stroke Maternal Grandmother    Asthma Son        exercise-induced   Allergic rhinitis Son    Diabetes Neg Hx    Esophageal cancer Neg Hx    Rectal cancer Neg Hx    Stomach cancer Neg Hx    No Known Allergies    Patient Care Team: Clayborne Dana, NP as PCP - General (Family Medicine)   Outpatient Medications Prior to Visit  Medication Sig   acetaminophen (TYLENOL) 500 MG tablet Take 1,000 mg by mouth every 6 (six) hours as needed.   albuterol (VENTOLIN HFA) 108 (90 Base) MCG/ACT inhaler Inhale 2 puffs into the lungs every 6 (six) hours as needed for wheezing.   Biotin 1000 MCG tablet Take 1,000 mcg by mouth daily.   cetirizine (ZYRTEC) 10 MG tablet Take 10 mg by mouth daily.   chlorpheniramine (CHLOR-TRIMETON) 4 MG tablet Take 1 tablet (4 mg total) by mouth at bedtime as needed for allergies or rhinitis.   fluticasone (FLONASE) 50 MCG/ACT nasal  spray Place 2 sprays into both nostrils daily.   fluticasone (FLOVENT HFA) 44 MCG/ACT inhaler Inhale 2 puffs into the lungs 2 (two) times daily.   ibuprofen (ADVIL) 200 MG tablet Take 200 mg by mouth every 6 (six) hours as needed.   loratadine (CLARITIN) 10 MG tablet Take 10 mg by mouth daily.   Multiple Vitamins-Minerals (MULTIVITAMIN WITH MINERALS) tablet Take 1 tablet by mouth daily.   omeprazole (PRILOSEC) 20 MG capsule Take 1 capsule (20 mg total) by mouth daily.   Facility-Administered Medications Prior to Visit  Medication Dose Route Frequency Provider   0.9 %  sodium chloride infusion  500 mL Intravenous Once Charlie Pitter III, MD    ROS All review of systems negative except what is listed in the HPI        Objective:     BP 110/82   Pulse 78   Ht 5\' 3"  (1.6 m)   Wt 142 lb (64.4 kg)   SpO2 100%   BMI 25.15 kg/m    Physical Exam Vitals reviewed. Exam conducted with a chaperone present.   Constitutional:      Appearance: Normal appearance.  Cardiovascular:     Rate and Rhythm: Normal rate and regular rhythm.     Pulses: Normal pulses.     Heart sounds: Normal heart sounds.  Pulmonary:     Effort: Pulmonary effort is normal.     Breath sounds: Normal breath sounds.  Genitourinary:    General: Normal vulva.     Exam position: Lithotomy position.     Labia:        Right: No rash, tenderness, lesion or injury.        Left: No rash, tenderness, lesion or injury.      Urethra: Prolapse present.     Vagina: Normal. No vaginal discharge or tenderness.     Cervix: No cervical motion tenderness, discharge, friability, lesion, erythema, cervical bleeding or eversion.     Uterus: Not deviated and not fixed.      Adnexa:        Right: No mass, tenderness or fullness.         Left: No mass, tenderness or fullness.       Comments: Manual exam - cervix symmetrical but firm, no CMT Skin:    General: Skin is warm and dry.  Neurological:     Mental Status: She is alert and oriented to person, place, and time.  Psychiatric:        Mood and Affect: Mood normal.        Behavior: Behavior normal.        Thought Content: Thought content normal.        Judgment: Judgment normal.         No results found for any visits on 01/19/23.     Assessment & Plan:    Routine Health Maintenance and Physical Exam Discussed health promotion and safety including diet and exercise recommendations, dental health, and injury prevention. Tobacco cessation if applicable. Seat belts, sunscreen, smoke detectors, etc.    Immunization History  Administered Date(s) Administered   Influenza Inj Mdck Quad Pf 03/26/2019   Influenza Whole 03/23/2010   Influenza,inj,Quad PF,6+ Mos 02/14/2013, 04/08/2017, 05/02/2020   Influenza-Unspecified 04/13/2015, 03/23/2018, 07/07/2021   Moderna Covid-19 Vaccine Bivalent Booster 68yrs & up 06/10/2021   PFIZER(Purple Top)SARS-COV-2 Vaccination 09/04/2019,  09/24/2019, 05/23/2020   Pfizer Covid-19 Vaccine Bivalent Booster 31yrs & up 10/16/2020   Tdap 02/10/2011, 09/19/2020    Health  Maintenance  Topic Date Due   HIV Screening  Never done   Hepatitis C Screening  Never done   Zoster Vaccines- Shingrix (1 of 2) Never done   PAP SMEAR-Modifier  09/15/2022   Colonoscopy  10/01/2022   COVID-19 Vaccine (6 - 2023-24 season) 01/16/2024 (Originally 02/21/2022)   INFLUENZA VACCINE  01/22/2023   MAMMOGRAM  04/09/2024   DTaP/Tdap/Td (3 - Td or Tdap) 09/20/2030   HPV VACCINES  Aged Out        Problem List Items Addressed This Visit   None Visit Diagnoses     Annual physical exam    -  Primary   Relevant Orders   CBC with Differential/Platelet   Comprehensive metabolic panel   Lipid panel   TSH   Encounter for screening for HIV       Relevant Orders   HIV Antibody (routine testing w rflx)   Encounter for hepatitis C screening test for low risk patient       Relevant Orders   Hepatitis C antibody   Screening for cervical cancer       Relevant Orders   Cytology - PAP( [facility])   Cervix abnormality     Cervix with some stenosis noted during pap, otherwise normal visually, but on palpation, very firm, marble-like, yet symmetrical. Patient agreeable to [place].    Relevant Orders   US Pelvic Complete With Transvaginal      Return in about 1 year (around 01/19/2024) for physical.      Clayborne Dana, NP

## 2023-01-27 ENCOUNTER — Ambulatory Visit (HOSPITAL_BASED_OUTPATIENT_CLINIC_OR_DEPARTMENT_OTHER): Payer: 59

## 2023-04-06 ENCOUNTER — Ambulatory Visit (HOSPITAL_BASED_OUTPATIENT_CLINIC_OR_DEPARTMENT_OTHER): Payer: 59

## 2023-04-27 ENCOUNTER — Ambulatory Visit (HOSPITAL_BASED_OUTPATIENT_CLINIC_OR_DEPARTMENT_OTHER): Payer: 59

## 2023-06-26 IMAGING — MG MM DIGITAL SCREENING BILAT W/ TOMO AND CAD
8 series · 9 of 24 positions shown · non-contrast
Comparison: Previous exam(s).

CLINICAL DATA: Screening.

EXAM:
DIGITAL SCREENING BILATERAL MAMMOGRAM WITH TOMOSYNTHESIS AND CAD
TECHNIQUE: Bilateral screening digital craniocaudal and mediolateral oblique
mammograms were obtained. Bilateral screening digital breast
tomosynthesis was performed. The images were evaluated with
computer-aided detection.

[L CC synth-2D]
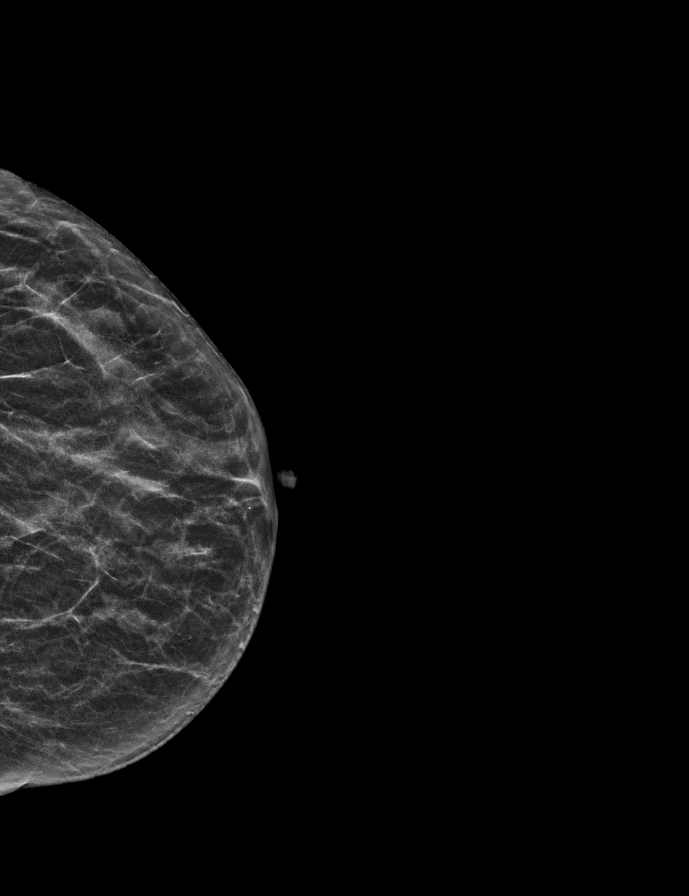

[R CC synth-2D]
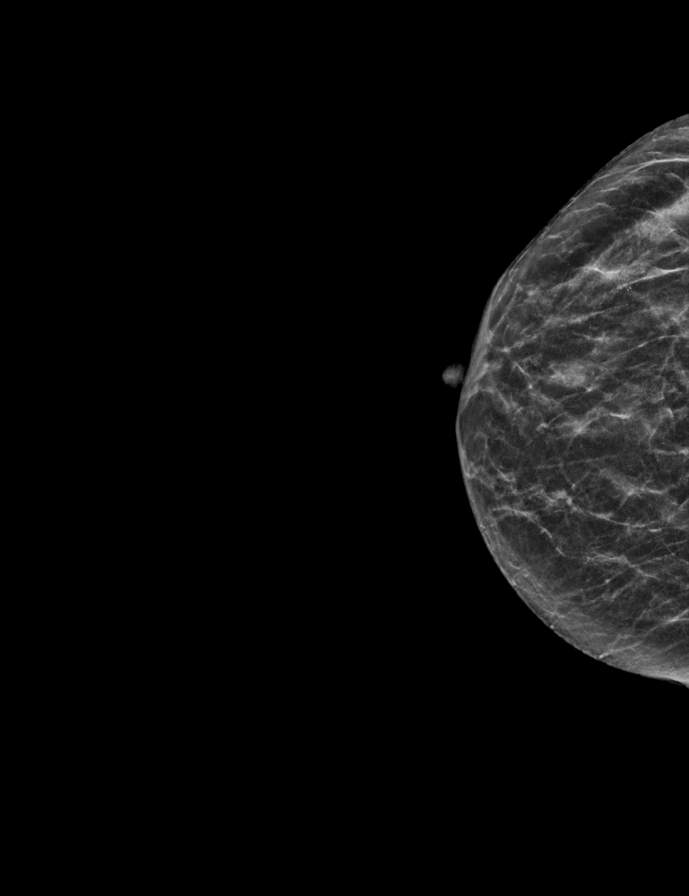

[L MLO synth-2D]
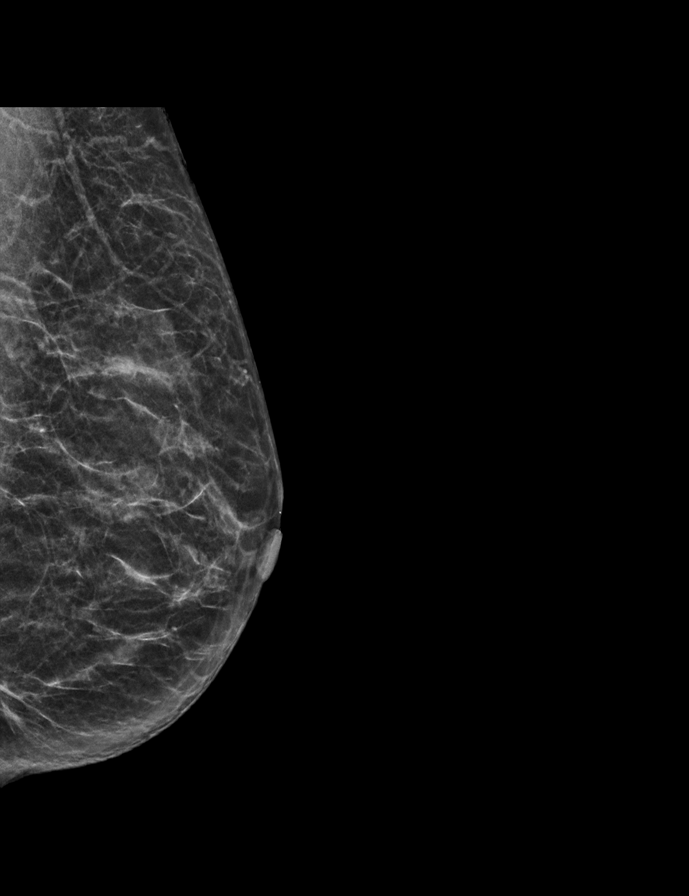

[R MLO synth-2D]
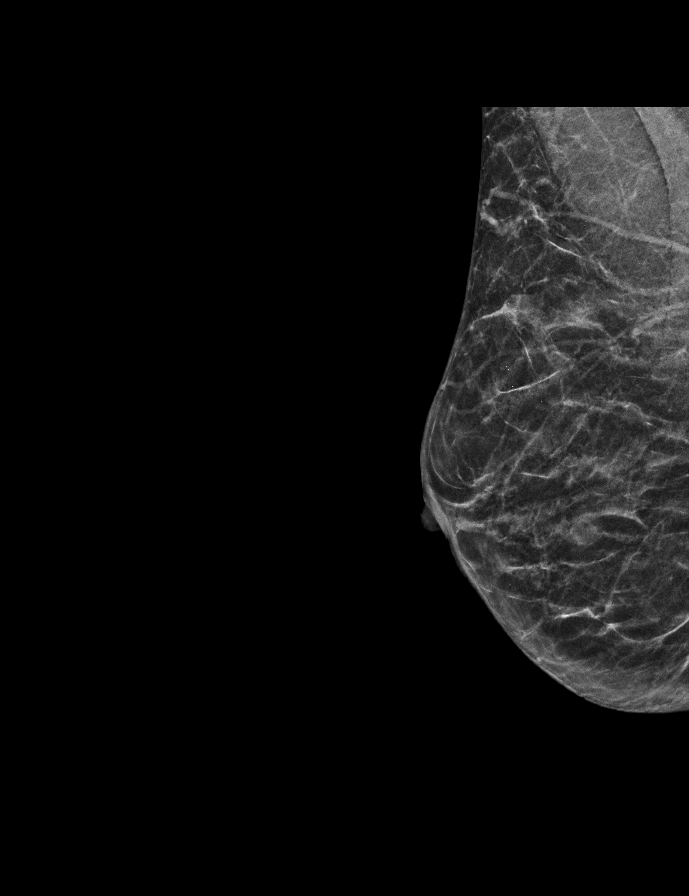

[R MLO tomo · 2 of 40 frames shown]
[frame 13/40]
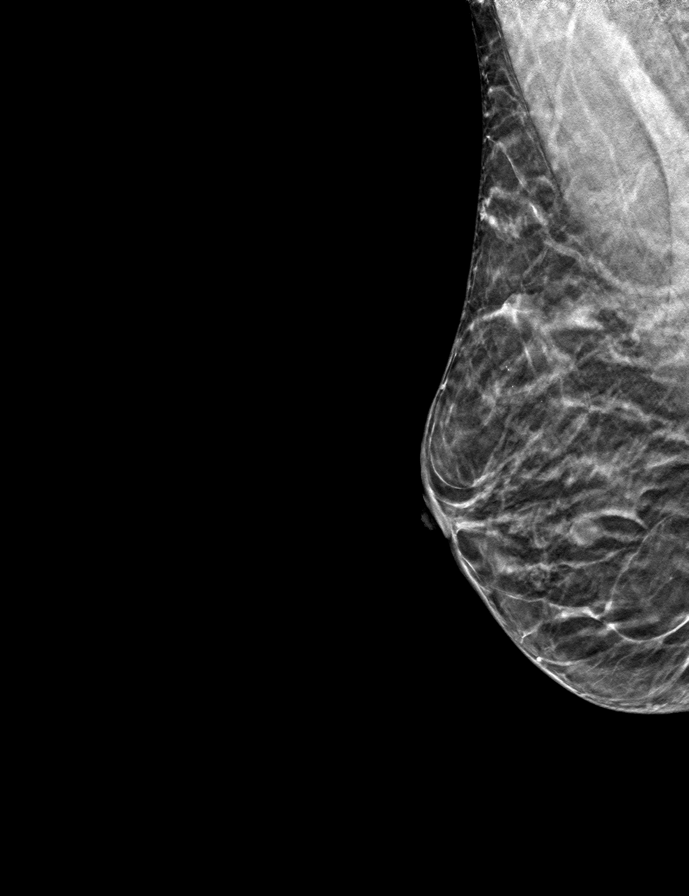
[frame 21/40]
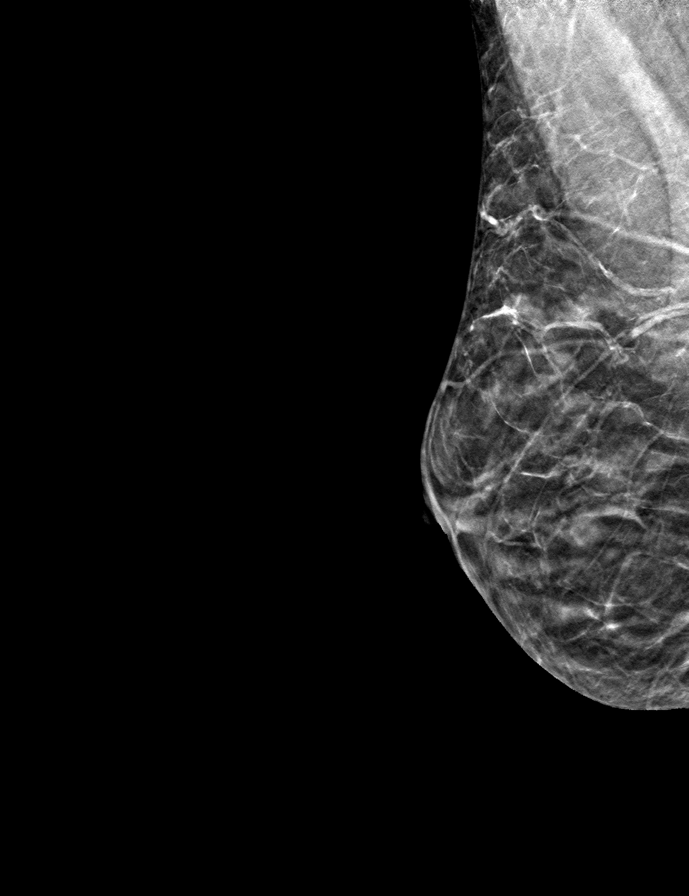

[L CC tomo · tomo slice 22/43.0]
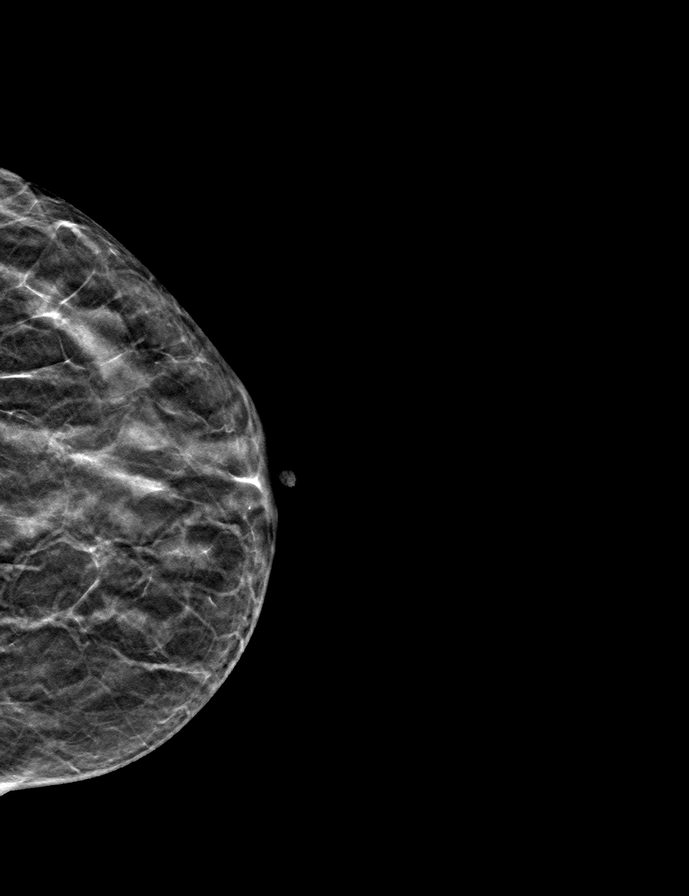

[L MLO tomo · tomo slice 23/46.0]
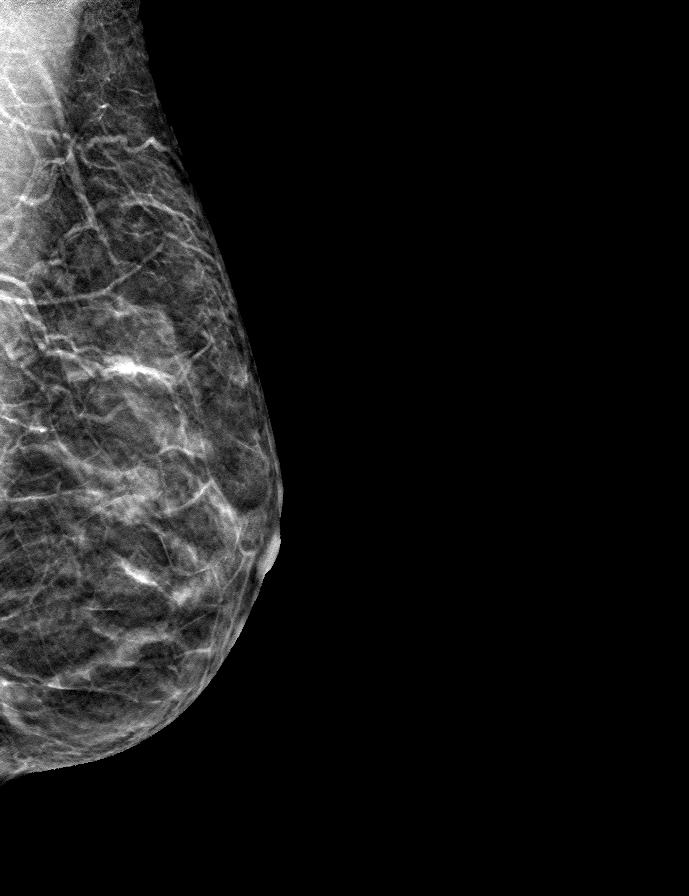

[R CC tomo · tomo slice 21/42.0]
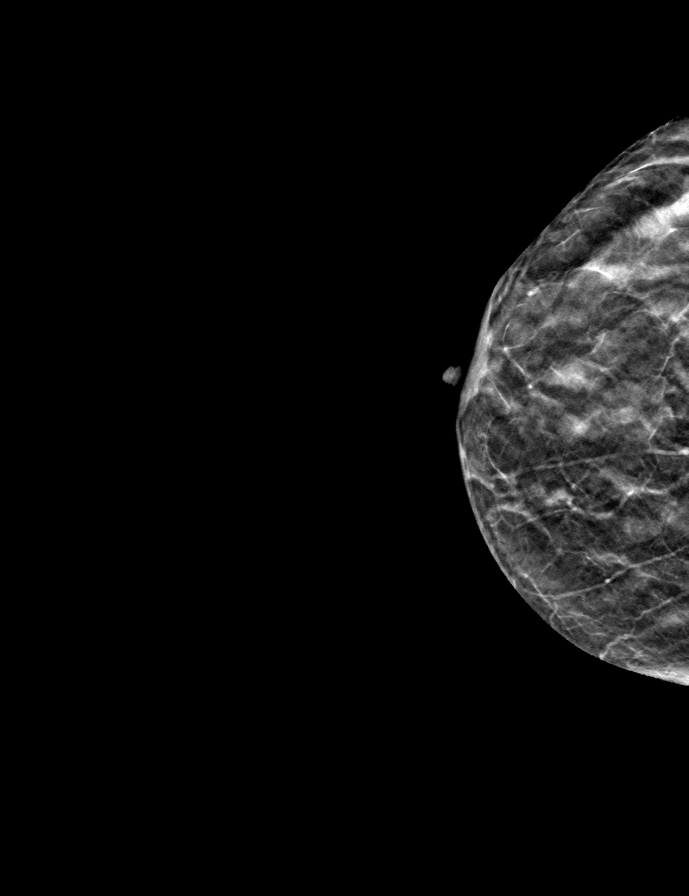

[9 of 24 positions shown; findings below may reference images not displayed]

ACR Breast Density Category b: There are scattered areas of
fibroglandular density.
FINDINGS: There are no findings suspicious for malignancy.
IMPRESSION: No mammographic evidence of malignancy. A result letter of this
screening mammogram will be mailed directly to the patient.

RECOMMENDATION:
Screening mammogram in one year. (Code:51-O-LD2)

BI-RADS CATEGORY  1: Negative.

## 2024-01-20 ENCOUNTER — Ambulatory Visit: Payer: Self-pay | Admitting: Family Medicine

## 2024-01-20 ENCOUNTER — Encounter: Payer: Self-pay | Admitting: Family Medicine

## 2024-01-20 ENCOUNTER — Ambulatory Visit (INDEPENDENT_AMBULATORY_CARE_PROVIDER_SITE_OTHER): Payer: 59 | Admitting: Family Medicine

## 2024-01-20 VITALS — BP 130/84 | HR 93 | Ht 63.0 in | Wt 142.0 lb

## 2024-01-20 DIAGNOSIS — Z0000 Encounter for general adult medical examination without abnormal findings: Secondary | ICD-10-CM

## 2024-01-20 DIAGNOSIS — R053 Chronic cough: Secondary | ICD-10-CM

## 2024-01-20 DIAGNOSIS — Z1211 Encounter for screening for malignant neoplasm of colon: Secondary | ICD-10-CM

## 2024-01-20 DIAGNOSIS — Z1231 Encounter for screening mammogram for malignant neoplasm of breast: Secondary | ICD-10-CM

## 2024-01-20 LAB — CBC WITH DIFFERENTIAL/PLATELET
Basophils Absolute: 0 K/uL (ref 0.0–0.1)
Basophils Relative: 1 % (ref 0.0–3.0)
Eosinophils Absolute: 0 K/uL (ref 0.0–0.7)
Eosinophils Relative: 0.5 % (ref 0.0–5.0)
HCT: 40.9 % (ref 36.0–46.0)
Hemoglobin: 13.8 g/dL (ref 12.0–15.0)
Lymphocytes Relative: 30.8 % (ref 12.0–46.0)
Lymphs Abs: 1.3 K/uL (ref 0.7–4.0)
MCHC: 33.7 g/dL (ref 30.0–36.0)
MCV: 94.7 fl (ref 78.0–100.0)
Monocytes Absolute: 0.4 K/uL (ref 0.1–1.0)
Monocytes Relative: 8.6 % (ref 3.0–12.0)
Neutro Abs: 2.5 K/uL (ref 1.4–7.7)
Neutrophils Relative %: 59.1 % (ref 43.0–77.0)
Platelets: 239 K/uL (ref 150.0–400.0)
RBC: 4.32 Mil/uL (ref 3.87–5.11)
RDW: 12.4 % (ref 11.5–15.5)
WBC: 4.3 K/uL (ref 4.0–10.5)

## 2024-01-20 LAB — COMPREHENSIVE METABOLIC PANEL WITH GFR
ALT: 15 U/L (ref 0–35)
AST: 23 U/L (ref 0–37)
Albumin: 4.5 g/dL (ref 3.5–5.2)
Alkaline Phosphatase: 49 U/L (ref 39–117)
BUN: 17 mg/dL (ref 6–23)
CO2: 28 meq/L (ref 19–32)
Calcium: 9.6 mg/dL (ref 8.4–10.5)
Chloride: 106 meq/L (ref 96–112)
Creatinine, Ser: 0.92 mg/dL (ref 0.40–1.20)
GFR: 68.96 mL/min (ref >60.00)
Glucose, Bld: 114 mg/dL — ABNORMAL HIGH (ref 70–99)
Potassium: 4.1 meq/L (ref 3.5–5.1)
Sodium: 141 meq/L (ref 135–145)
Total Bilirubin: 0.6 mg/dL (ref 0.2–1.2)
Total Protein: 6.9 g/dL (ref 6.0–8.3)

## 2024-01-20 LAB — LIPID PANEL
Cholesterol: 158 mg/dL (ref 0–200)
HDL: 83.5 mg/dL (ref >39.00)
LDL Cholesterol: 69 mg/dL (ref 0–99)
NonHDL: 74.42
Total CHOL/HDL Ratio: 2
Triglycerides: 28 mg/dL (ref 0.0–149.0)
VLDL: 5.6 mg/dL (ref 0.0–40.0)

## 2024-01-20 LAB — TSH: TSH: 0.78 u[IU]/mL (ref 0.35–5.50)

## 2024-01-20 MED ORDER — ALBUTEROL SULFATE HFA 108 (90 BASE) MCG/ACT IN AERS: 2.0000 | INHALATION_SPRAY | Freq: Four times a day (QID) | RESPIRATORY_TRACT | 11 refills | Status: AC | PRN

## 2024-01-20 NOTE — Progress Notes (Signed)
 Complete physical exam  Patient: [name]   DOB: 12/07/1965   58 y.o. Female  MRN: [id number]  Subjective:    Chief Complaint  Patient presents with   Annual Exam    [name] is a 58 y.o. female who presents today for a complete physical exam. She reports consuming a general, pescatarian diet. Home exercise routine includes HIIT and cardio 5-6 days per week. She generally feels well. She reports sleeping well. She does not have additional problems to discuss today.   Currently lives with: husband and son Acute concerns or interim problems since last visit: no  Vision concerns: no concerns Dental concerns: no concerns STD concerns: no concerns   Patient denies ETOH use. Patient denies nicotine use. Patient denies illegal substance use.     Most recent fall risk assessment:    01/20/2024    8:38 AM  Fall Risk   Falls in the past year? 0  Number falls in past yr: 0  Injury with Fall? 0  Risk for fall due to : No Fall Risks  Follow up Falls evaluation completed     Most recent depression screenings:    01/20/2024    8:38 AM 01/19/2023    8:34 AM  PHQ 2/9 Scores  PHQ - 2 Score 0 0       Patient Active Problem List   Diagnosis Date Noted   Uterine fibromyoma 05/09/2015    Class: Present on Admission   Anemia 02/01/2015   Diverticulosis of colon without hemorrhage 02/01/2015   Constipation 02/01/2015   Allergic rhinitis 11/02/2013   Endometrial polyp 08/15/2013    Class: Present on Admission   Inguinal hernia, left 02/10/2011   Family history of hyperlipidemia 02/10/2011   Family History  Problem Relation Age of Onset   Hyperlipidemia Mother    Hypertension Mother    Arthritis Mother    Colon cancer Mother 21   Cancer Mother        colon   Alzheimer's disease Mother 45       mild   Dementia Mother    Arthritis Father    Hypertension Father    Hyperlipidemia Father    Cancer Father 77       leukemia  (?CLL?)   Stroke Brother    Hypertension Brother    Stroke Maternal Aunt 74   Heart disease Maternal Aunt 72       CABG   Stroke Maternal Grandmother    Asthma Son        exercise-induced   Allergic rhinitis Son    Diabetes Neg Hx    Esophageal cancer Neg Hx    Rectal cancer Neg Hx    Stomach cancer Neg Hx    No Known Allergies    Patient Care Team: Almarie Waddell NOVAK, NP as PCP - General (Family Medicine)   Outpatient Medications Prior to Visit  Medication Sig   acetaminophen  (TYLENOL ) 500 MG tablet Take 1,000 mg by mouth every 6 (six) hours as needed.   albuterol  (VENTOLIN  HFA) 108 (90 Base) MCG/ACT inhaler Inhale 2 puffs into the lungs every 6 (six) hours as needed for wheezing.   Biotin 1000 MCG tablet Take 1,000 mcg by mouth daily.   cetirizine (ZYRTEC) 10 MG tablet Take 10 mg by mouth daily.   chlorpheniramine  (CHLOR-TRIMETON ) 4 MG tablet Take 1 tablet (4 mg total) by mouth at bedtime as needed for allergies or rhinitis.   fluticasone  (FLONASE ) 50 MCG/ACT nasal  spray Place 2 sprays into both nostrils daily.   fluticasone  (FLOVENT  HFA) 44 MCG/ACT inhaler Inhale 2 puffs into the lungs 2 (two) times daily.   ibuprofen (ADVIL) 200 MG tablet Take 200 mg by mouth every 6 (six) hours as needed.   loratadine (CLARITIN) 10 MG tablet Take 10 mg by mouth daily.   Multiple Vitamins-Minerals (MULTIVITAMIN WITH MINERALS) tablet Take 1 tablet by mouth daily.   omeprazole  (PRILOSEC) 20 MG capsule Take 1 capsule (20 mg total) by mouth daily.   Facility-Administered Medications Prior to Visit  Medication Dose Route Frequency Provider   0.9 %  sodium chloride  infusion  500 mL Intravenous Once Danis, Henry L III, MD    ROS All review of systems negative except what is listed in the HPI        Objective:     BP 130/84   Pulse 93   Ht 5' 3 (1.6 m)   Wt 142 lb (64.4 kg)   SpO2 100%   BMI 25.15 kg/m    Physical Exam Vitals reviewed.  Constitutional:      Appearance: Normal  appearance.  HENT:     Head: Normocephalic and atraumatic.     Right Ear: Tympanic membrane normal.     Left Ear: Tympanic membrane normal.     Nose: Nose normal.  Eyes:     Extraocular Movements: Extraocular movements intact.     Pupils: Pupils are equal, round, and reactive to light.  Cardiovascular:     Rate and Rhythm: Normal rate and regular rhythm.     Pulses: Normal pulses.     Heart sounds: Normal heart sounds.  Pulmonary:     Effort: Pulmonary effort is normal.     Breath sounds: Normal breath sounds.  Genitourinary:    Vagina: Normal.     Comments: Deferred  Musculoskeletal:     Cervical back: Normal range of motion and neck supple. No tenderness.  Lymphadenopathy:     Cervical: No cervical adenopathy.  Skin:    General: Skin is warm and dry.  Neurological:     Mental Status: She is alert and oriented to person, place, and time.  Psychiatric:        Mood and Affect: Mood normal.        Behavior: Behavior normal.        Thought Content: Thought content normal.        Judgment: Judgment normal.        No results found for any visits on 01/20/24.     Assessment & Plan:    Routine Health Maintenance and Physical Exam Discussed health promotion and safety including diet and exercise recommendations, dental health, and injury prevention. Tobacco cessation if applicable. Seat belts, sunscreen, smoke detectors, etc.    Immunization History  Administered Date(s) Administered   Influenza Inj Mdck Quad Pf 03/26/2019, 03/31/2022   Influenza Whole 03/23/2010   Influenza,inj,Quad PF,6+ Mos 02/14/2013, 04/08/2017, 05/02/2020   Influenza-Unspecified 04/13/2015, 03/23/2018, 07/07/2021   Moderna Covid-19 Vaccine Bivalent Booster 42yrs & up 06/10/2021   PFIZER Comirnaty(Gray Top)Covid-19 Tri-Sucrose Vaccine 10/16/2020   PFIZER(Purple Top)SARS-COV-2 Vaccination 09/04/2019, 09/20/2019, 09/24/2019, 05/23/2020   Pfizer Covid-19 Vaccine Bivalent Booster 30yrs & up 10/16/2020    Tdap 02/10/2011, 09/19/2020    Health Maintenance  Topic Date Due   Zoster Vaccines- Shingrix (1 of 2) Never done   Colonoscopy  10/01/2022   Hepatitis B Vaccines (1 of 3 - 19+ 3-dose series) 01/18/2025 (Originally 03/21/1985)   COVID-19 Vaccine (8 -  2024-25 season) 01/18/2025 (Originally 02/22/2023)   INFLUENZA VACCINE  01/22/2024   MAMMOGRAM  04/09/2024   Cervical Cancer Screening (HPV/Pap Cotest)  01/19/2028   DTaP/Tdap/Td (3 - Td or Tdap) 09/20/2030   Hepatitis C Screening  Completed   HIV Screening  Completed   HPV VACCINES  Aged Out   Meningococcal B Vaccine  Aged Out         Return in about 1 year (around 01/19/2025) for physical.      Waddell KATHEE Mon, NP

## 2024-01-26 ENCOUNTER — Ambulatory Visit (HOSPITAL_BASED_OUTPATIENT_CLINIC_OR_DEPARTMENT_OTHER)
Admission: RE | Admit: 2024-01-26 | Discharge: 2024-01-26 | Disposition: A | Discharge status: Home / Self Care | Payer: Self-pay | Source: Ambulatory Visit | Attending: Family Medicine | Admitting: Family Medicine

## 2024-01-26 ENCOUNTER — Encounter (HOSPITAL_BASED_OUTPATIENT_CLINIC_OR_DEPARTMENT_OTHER): Payer: Self-pay

## 2024-01-26 DIAGNOSIS — Z1231 Encounter for screening mammogram for malignant neoplasm of breast: Secondary | ICD-10-CM | POA: Diagnosis present

## 2024-03-08 ENCOUNTER — Encounter: Payer: Self-pay | Admitting: Gastroenterology

## 2024-03-14 ENCOUNTER — Other Ambulatory Visit (HOSPITAL_BASED_OUTPATIENT_CLINIC_OR_DEPARTMENT_OTHER): Payer: Self-pay

## 2024-03-14 MED ORDER — FLUZONE 0.5 ML IM SUSY
0.5000 mL | PREFILLED_SYRINGE | Freq: Once | INTRAMUSCULAR | 0 refills | Status: AC
  Filled 2024-03-14: qty 0.5, 1d supply, fill #0

## 2024-04-11 ENCOUNTER — Ambulatory Visit (AMBULATORY_SURGERY_CENTER)

## 2024-04-11 VITALS — Ht 65.0 in | Wt 140.0 lb

## 2024-04-11 DIAGNOSIS — Z800 Family history of malignant neoplasm of digestive organs: Secondary | ICD-10-CM

## 2024-04-11 MED ORDER — NA SULFATE-K SULFATE-MG SULF 17.5-3.13-1.6 GM/177ML PO SOLN: 1.0000 | Freq: Once | ORAL | 0 refills | Status: AC

## 2024-04-11 NOTE — Progress Notes (Signed)
 No egg or soy allergy  known to patient  No issues known to pt with past sedation with any surgeries or procedures Patient denies ever being told they had issues or difficulty with intubation  No FH of Malignant Hyperthermia Pt is not on diet pills Pt is not on  home 02  Pt is not on blood thinners  Pt denies issues with constipation-PATIENT DENIED ANY ISSUES W/CONSTIPATION   No A fib or A flutter Have any cardiac testing pending--NO Pt can ambulate- INDEPENDENTLY Pt denies use of chewing tobacco Discussed diabetic I weight loss medication holds Discussed NSAID holds Checked BMI Pt instructed to use [employer] or GoodRx for a price reduction on prep  Patient's chart reviewed by Norleen Schillings CNRA prior to previsit and patient appropriate for the [facility].  Pre visit completed and red dot placed by patient's name on their procedure day (on provider's schedule).

## 2024-04-18 ENCOUNTER — Telehealth: Payer: Self-pay | Admitting: Gastroenterology

## 2024-04-18 MED ORDER — NA SULFATE-K SULFATE-MG SULF 17.5-3.13-1.6 GM/177ML PO SOLN: 1.0000 | Freq: Once | ORAL | 0 refills | Status: DC

## 2024-04-18 NOTE — Telephone Encounter (Signed)
 RN returned call to patient regarding her prep medication. RN confirmed the preferred pharmacy and sent Suprep to that pharmacy as per standing order by Dr. Legrand.

## 2024-04-18 NOTE — Telephone Encounter (Signed)
 Inbound call from patient stating that she has to have her prep medication authorized by the doctor. Patient is needing it sent to [facility] on [address]. Please advise.

## 2024-04-21 ENCOUNTER — Other Ambulatory Visit: Payer: Self-pay

## 2024-04-21 DIAGNOSIS — Z800 Family history of malignant neoplasm of digestive organs: Secondary | ICD-10-CM

## 2024-04-21 MED ORDER — NA SULFATE-K SULFATE-MG SULF 17.5-3.13-1.6 GM/177ML PO SOLN: 1.0000 | Freq: Once | ORAL | 0 refills | Status: AC

## 2024-04-21 NOTE — Telephone Encounter (Signed)
 Inbound call from patient stating pharmacy is still stating she needs prep medication authorized by provider. Procedure is scheduled for 04/25/24 Please advise  Thank you

## 2024-04-21 NOTE — Telephone Encounter (Signed)
 Spoke with patient, made aware we do not provide pre authorization for prep solutions. Good rx coupon has been sent and prep resent to [facility]. Pt to present at time of pickup for price reduction. Note sent to pharmacy to fill the prep medication.

## 2024-04-25 ENCOUNTER — Encounter: Payer: Self-pay | Admitting: Gastroenterology

## 2024-04-25 ENCOUNTER — Ambulatory Visit: Admitting: Gastroenterology

## 2024-04-25 VITALS — BP 105/73 | HR 70 | Temp 98.3°F | Resp 21 | Ht 63.0 in | Wt 140.0 lb

## 2024-04-25 DIAGNOSIS — Z800 Family history of malignant neoplasm of digestive organs: Secondary | ICD-10-CM

## 2024-04-25 DIAGNOSIS — Z1211 Encounter for screening for malignant neoplasm of colon: Secondary | ICD-10-CM

## 2024-04-25 DIAGNOSIS — K648 Other hemorrhoids: Secondary | ICD-10-CM

## 2024-04-25 DIAGNOSIS — D120 Benign neoplasm of cecum: Secondary | ICD-10-CM

## 2024-04-25 MED ORDER — SODIUM CHLORIDE 0.9 % IV SOLN: 500.0000 mL | INTRAVENOUS | Status: AC

## 2024-04-25 NOTE — Progress Notes (Signed)
 Data will not transfer from monitor, vs being posted manually.

## 2024-04-25 NOTE — Progress Notes (Signed)
 Called to room to assist during endoscopic procedure.  Patient ID and intended procedure confirmed with present staff. Received instructions for my participation in the procedure from the performing physician.

## 2024-04-25 NOTE — Progress Notes (Signed)
 History and Physical:  This patient presents for endoscopic testing for: Encounter Diagnosis  Name Primary?   Family history of colon cancer Yes    58 year old woman here today for screening colonoscopy with a family history of colon cancer.  No polyps on her April 2019 colonoscopy.  Patient denies chronic abdominal pain, rectal bleeding, constipation or diarrhea.   Patient is otherwise without complaints or active issues today.   Past Medical History: Past Medical History:  Diagnosis Date   Allergy     Endometrial polyp    Iron  deficiency anemia    Wears contact lenses      Past Surgical History: Past Surgical History:  Procedure Laterality Date   BREAST BIOPSY Right 08/13/2012   Procedure: BREAST BIOPSY WITH NEEDLE LOCALIZATION;  Surgeon: Morene ONEIDA Olives, MD;  Location: [facility];  Service: General;  Laterality: Right;   lobular neoplasia   CESAREAN SECTION   1988/  02-13-2003/   06-27-2004   LAST C/S  W/ BILATERAL TUBAL LIGATION   DILATATION & CURETTAGE/HYSTEROSCOPY WITH MYOSURE  05/09/2015   Procedure: MELINDA;  Surgeon: Norleen Skill, MD;  Location: [facility];  Service: Gynecology;;   DILATATION & CURRETTAGE/HYSTEROSCOPY WITH RESECTOCOPE N/A 08/15/2013   Procedure: DILATATION & CURETTAGE/HYSTEROSCOPY WITH RESECTOCOPE;  Surgeon: Norleen GORMAN Skill, MD;  Location: [facility];  Service: Gynecology;  Laterality: N/A;   DILITATION & CURRETTAGE/HYSTROSCOPY WITH HYDROTHERMAL ABLATION N/A 05/09/2015   Procedure: DILATATION & CURETTAGE/HYSTEROSCOPY WITH HYDROTHERMAL ABLATION;  Surgeon: Norleen Skill, MD;  Location: [facility];  Service: Gynecology;  Laterality: N/A;   MULTIPLE TOOTH EXTRACTIONS  2018   had all upper teeth pulled   TUBAL LIGATION      Allergies: No Known Allergies  Outpatient Meds: Current Outpatient Medications  Medication Sig Dispense Refill   fluticasone  (FLONASE ) 50 MCG/ACT nasal spray Place 2 sprays into both nostrils daily. 16 g 5    fluticasone  (FLOVENT  HFA) 44 MCG/ACT inhaler Inhale 2 puffs into the lungs 2 (two) times daily. 1 each 12   ibuprofen (ADVIL) 200 MG tablet Take 200 mg by mouth every 6 (six) hours as needed.     loratadine (CLARITIN) 10 MG tablet Take 10 mg by mouth daily.     Multiple Vitamins-Minerals (MULTIVITAMIN WITH MINERALS) tablet Take 1 tablet by mouth daily.     acetaminophen  (TYLENOL ) 500 MG tablet Take 1,000 mg by mouth every 6 (six) hours as needed.     albuterol  (VENTOLIN  HFA) 108 (90 Base) MCG/ACT inhaler Inhale 2 puffs into the lungs every 6 (six) hours as needed for wheezing. 2 each 11   Biotin 1000 MCG tablet Take 1,000 mcg by mouth daily. (Patient not taking: Reported on 04/11/2024)     cetirizine (ZYRTEC) 10 MG tablet Take 10 mg by mouth daily.     chlorpheniramine  (CHLOR-TRIMETON ) 4 MG tablet Take 1 tablet (4 mg total) by mouth at bedtime as needed for allergies or rhinitis. (Patient not taking: Reported on 04/11/2024) 14 tablet 0   omeprazole  (PRILOSEC) 20 MG capsule Take 1 capsule (20 mg total) by mouth daily. (Patient not taking: No sig reported) 30 capsule 5   Current Facility-Administered Medications  Medication Dose Route Frequency Provider Last Rate Last Admin   0.9 %  sodium chloride  infusion  500 mL Intravenous Once Danis, Victory CROME III, MD       0.9 %  sodium chloride  infusion  500 mL Intravenous Continuous Danis, Victory CROME MOULD, MD  ___________________________________________________________________ Objective   Exam:  BP 136/77   Pulse 76   Temp 98.3 F (36.8 C) (Temporal)   Resp 18   Ht 5' 3 (1.6 m)   Wt 140 lb (63.5 kg)   SpO2 100%   BMI 24.80 kg/m   CV: regular , S1/S2 Resp: clear to auscultation bilaterally, normal RR and effort noted GI: soft, no tenderness, with active bowel sounds.   Assessment: Encounter Diagnosis  Name Primary?   Family history of colon cancer Yes     Plan: Colonoscopy   The benefits and risks of the planned  procedure(s) were described in detail with the patient or (when appropriate) their health care proxy.  Risks were outlined as including, but not limited to, bleeding, infection, perforation, adverse medication reaction leading to cardiac or pulmonary decompensation, pancreatitis (if ERCP).  The limitation of incomplete mucosal visualization was also discussed.  No guarantees or warranties were given.  The patient is appropriate for an endoscopic procedure in the ambulatory setting.   - Victory Brand, MD

## 2024-04-25 NOTE — Progress Notes (Signed)
 Pt's states no medical or surgical changes since previsit or office visit.

## 2024-04-25 NOTE — Progress Notes (Signed)
 Report given to PACU, vss

## 2024-04-25 NOTE — Patient Instructions (Signed)
 YOU HAD AN ENDOSCOPIC PROCEDURE TODAY AT THE [facility]:   Refer to the procedure report that was given to you for any specific questions about what was found during the examination.  If the procedure report does not answer your questions, please call your gastroenterologist to clarify.  If you requested that your care partner not be given the details of your procedure findings, then the procedure report has been included in a sealed envelope for you to review at your convenience later.  YOU SHOULD EXPECT: Some feelings of bloating in the abdomen. Passage of more gas than usual.  Walking can help get rid of the air that was put into your GI tract during the procedure and reduce the bloating. If you had a lower endoscopy (such as a colonoscopy or flexible sigmoidoscopy) you may notice spotting of blood in your stool or on the toilet paper. If you underwent a bowel prep for your procedure, you may not have a normal bowel movement for a few days.  Please Note:  You might notice some irritation and congestion in your nose or some drainage.  This is from the oxygen used during your procedure.  There is no need for concern and it should clear up in a day or so.  SYMPTOMS TO REPORT IMMEDIATELY:  Following lower endoscopy (colonoscopy or flexible sigmoidoscopy):  Excessive amounts of blood in the stool  Significant tenderness or worsening of abdominal pains  Swelling of the abdomen that is new, acute  Fever of 100F or higher  Following upper endoscopy (EGD)  Vomiting of blood or coffee ground material  New chest pain or pain under the shoulder blades  Painful or persistently difficult swallowing  New shortness of breath  Fever of 100F or higher  Black, tarry-looking stools  Resume previous diet Continue present medications Await pathology results Repeat colonoscopy in 5 years  For urgent or emergent issues, a gastroenterologist can be reached at any hour by calling [phone]  [phone]. Do not use MyChart messaging for urgent concerns.    DIET:  We do recommend a small meal at first, but then you may proceed to your regular diet.  Drink plenty of fluids but you should avoid alcoholic beverages for 24 hours.  ACTIVITY:  You should plan to take it easy for the rest of today and you should NOT DRIVE or use heavy machinery until tomorrow (because of the sedation medicines used during the test).    FOLLOW UP: Our staff will call the number listed on your records the next business day following your procedure.  We will call around 7:15- 8:00 am to check on you and address any questions or concerns that you may have regarding the information given to you following your procedure. If we do not reach you, we will leave a message.     If any biopsies were taken you will be contacted by phone or by letter within the next 1-3 weeks.  Please call us  at [phone] if you have not heard about the biopsies in 3 weeks.    SIGNATURES/CONFIDENTIALITY: You and/or your care partner have signed paperwork which will be entered into your electronic medical record.  These signatures attest to the fact that that the information above on your After Visit Summary has been reviewed and is understood.  Full responsibility of the confidentiality of this discharge information lies with you and/or your care-partner.

## 2024-04-25 NOTE — Op Note (Signed)
 [facility] Patient Name: [name] Procedure Date: 04/25/2024 8:42 AM MRN: [id number] Endoscopist: Victory L. Legrand , MD, [id number] Age: 58 Referring MD:  Date of Birth: 1966/02/15 Gender: Female Account #: [id number] Procedure:                Colonoscopy Indications:              Screening in patient at increased risk: Colorectal                            cancer in mother 41 or older                           No polyps last colonoscopy April 2019 Medicines:                Monitored Anesthesia Care Procedure:                Pre-Anesthesia Assessment:                           - Prior to the procedure, a History and Physical                            was performed, and patient medications and                            allergies were reviewed. The patient's tolerance of                            previous anesthesia was also reviewed. The risks                            and benefits of the procedure and the sedation                            options and risks were discussed with the patient.                            All questions were answered, and informed consent                            was obtained. Prior Anticoagulants: The patient has                            taken no anticoagulant or antiplatelet agents. ASA                            Grade Assessment: I - A normal, healthy patient.                            After reviewing the risks and benefits, the patient                            was deemed in satisfactory condition to undergo the  procedure.                           After obtaining informed consent, the colonoscope                            was passed under direct vision. Throughout the                            procedure, the patient's blood pressure, pulse, and                            oxygen saturations were monitored continuously. The                            Colonoscope was introduced through the anus and                             advanced to the the cecum, identified by                            appendiceal orifice and ileocecal valve. The                            colonoscopy was performed without difficulty. The                            patient tolerated the procedure well. The quality                            of the bowel preparation was excellent. The                            ileocecal valve, appendiceal orifice, and rectum                            were photographed. Scope In: 8:54:09 AM Scope Out: 9:06:59 AM Scope Withdrawal Time: 0 hours 9 minutes 2 seconds  Total Procedure Duration: 0 hours 12 minutes 50 seconds  Findings:                 The perianal and digital rectal examinations were                            normal.                           Repeat examination of right colon under NBI                            performed.                           A 5 mm polyp was found in the cecum. The polyp was  flat. The polyp was removed with a cold snare.                            Resection and retrieval were complete.                           Internal hemorrhoids were found. The hemorrhoids                            were small.                           The exam was otherwise without abnormality on                            direct and retroflexion views. Complications:            No immediate complications. Estimated Blood Loss:     Estimated blood loss was minimal. Impression:               - One 5 mm polyp in the cecum, removed with a cold                            snare. Resected and retrieved.                           - Internal hemorrhoids.                           - The examination was otherwise normal on direct                            and retroflexion views. Recommendation:           - Patient has a contact number available for                            emergencies. The signs and symptoms of potential                             delayed complications were discussed with the                            patient. Return to normal activities tomorrow.                            Written discharge instructions were provided to the                            patient.                           - Resume previous diet.                           - Continue present medications.                           -  Await pathology results.                           - Repeat colonoscopy in 5 years for surveillance                            and screening. Desi Rowe L. Legrand, MD 04/25/2024 9:13:22 AM This report has been signed electronically.

## 2024-04-26 ENCOUNTER — Telehealth: Payer: Self-pay | Admitting: *Deleted

## 2024-04-26 NOTE — Telephone Encounter (Signed)
  Follow up Call-     04/25/2024    7:54 AM  Call back number  Post procedure Call Back phone  # [phone]  Permission to leave phone message Yes     Patient questions:  Do you have a fever, pain , or abdominal swelling? No. Pain Score  0 *  Have you tolerated food without any problems? Yes.    Have you been able to return to your normal activities? Yes.    Do you have any questions about your discharge instructions: Diet   No. Medications  No. Follow up visit  No.  Do you have questions or concerns about your Care? No.  Actions: * If pain score is 4 or above: No action needed, pain <4.

## 2024-04-27 LAB — SURGICAL PATHOLOGY

## 2024-04-28 ENCOUNTER — Ambulatory Visit: Payer: Self-pay | Admitting: Gastroenterology
